# Patient Record
Sex: Female | Born: 1950 | Race: White | Hispanic: No | Marital: Married | Smoking: Former smoker
Health system: Southern US, Community
[De-identification: ages and names within clinical notes are randomized; demographics above are authoritative.]

## PROBLEM LIST (undated history)

## (undated) DIAGNOSIS — I341 Nonrheumatic mitral (valve) prolapse: Secondary | ICD-10-CM

## (undated) DIAGNOSIS — K219 Gastro-esophageal reflux disease without esophagitis: Secondary | ICD-10-CM

## (undated) DIAGNOSIS — I10 Essential (primary) hypertension: Secondary | ICD-10-CM

## (undated) DIAGNOSIS — M797 Fibromyalgia: Secondary | ICD-10-CM

## (undated) DIAGNOSIS — H269 Unspecified cataract: Secondary | ICD-10-CM

## (undated) DIAGNOSIS — G709 Myoneural disorder, unspecified: Secondary | ICD-10-CM

## (undated) DIAGNOSIS — M1990 Unspecified osteoarthritis, unspecified site: Secondary | ICD-10-CM

## (undated) DIAGNOSIS — J45909 Unspecified asthma, uncomplicated: Secondary | ICD-10-CM

## (undated) HISTORY — PX: AUGMENTATION MAMMAPLASTY: SUR837

## (undated) HISTORY — PX: TUBAL LIGATION: SHX77

## (undated) HISTORY — DX: Unspecified cataract: H26.9

## (undated) HISTORY — DX: Nonrheumatic mitral (valve) prolapse: I34.1

## (undated) HISTORY — PX: ABDOMINAL HYSTERECTOMY: SHX81

## (undated) HISTORY — PX: TONSILLECTOMY: SUR1361

## (undated) HISTORY — DX: Essential (primary) hypertension: I10

---

## 1998-09-09 ENCOUNTER — Encounter: Payer: Self-pay | Admitting: *Deleted

## 1998-09-14 ENCOUNTER — Inpatient Hospital Stay (HOSPITAL_COMMUNITY): Admission: RE | Admit: 1998-09-14 | Discharge: 1998-09-15 | Payer: Self-pay | Admitting: *Deleted

## 1999-03-02 ENCOUNTER — Encounter: Admission: RE | Admit: 1999-03-02 | Discharge: 1999-03-02 | Payer: Self-pay | Admitting: *Deleted

## 1999-03-02 ENCOUNTER — Encounter: Payer: Self-pay | Admitting: *Deleted

## 1999-04-12 ENCOUNTER — Other Ambulatory Visit: Admission: RE | Admit: 1999-04-12 | Discharge: 1999-04-12 | Payer: Self-pay | Admitting: *Deleted

## 1999-04-24 ENCOUNTER — Ambulatory Visit (HOSPITAL_COMMUNITY): Admission: RE | Admit: 1999-04-24 | Discharge: 1999-04-24 | Payer: Self-pay | Admitting: *Deleted

## 1999-04-24 ENCOUNTER — Encounter: Payer: Self-pay | Admitting: *Deleted

## 1999-05-18 ENCOUNTER — Encounter: Admission: RE | Admit: 1999-05-18 | Discharge: 1999-05-18 | Payer: Self-pay | Admitting: *Deleted

## 1999-05-18 ENCOUNTER — Encounter: Payer: Self-pay | Admitting: *Deleted

## 1999-06-12 ENCOUNTER — Encounter: Admission: RE | Admit: 1999-06-12 | Discharge: 1999-06-12 | Payer: Self-pay | Admitting: Neurology

## 1999-06-12 ENCOUNTER — Encounter: Payer: Self-pay | Admitting: Neurology

## 2000-03-06 ENCOUNTER — Encounter: Payer: Self-pay | Admitting: *Deleted

## 2000-03-06 ENCOUNTER — Encounter: Admission: RE | Admit: 2000-03-06 | Discharge: 2000-03-06 | Payer: Self-pay | Admitting: *Deleted

## 2000-05-06 ENCOUNTER — Other Ambulatory Visit: Admission: RE | Admit: 2000-05-06 | Discharge: 2000-05-06 | Payer: Self-pay | Admitting: *Deleted

## 2001-03-10 ENCOUNTER — Encounter: Admission: RE | Admit: 2001-03-10 | Discharge: 2001-03-10 | Payer: Self-pay | Admitting: *Deleted

## 2001-03-10 ENCOUNTER — Encounter: Payer: Self-pay | Admitting: *Deleted

## 2001-05-13 ENCOUNTER — Other Ambulatory Visit: Admission: RE | Admit: 2001-05-13 | Discharge: 2001-05-13 | Payer: Self-pay | Admitting: *Deleted

## 2002-06-08 ENCOUNTER — Other Ambulatory Visit: Admission: RE | Admit: 2002-06-08 | Discharge: 2002-06-08 | Payer: Self-pay | Admitting: *Deleted

## 2003-07-15 ENCOUNTER — Other Ambulatory Visit: Admission: RE | Admit: 2003-07-15 | Discharge: 2003-07-15 | Payer: Self-pay | Admitting: *Deleted

## 2004-10-10 ENCOUNTER — Other Ambulatory Visit: Admission: RE | Admit: 2004-10-10 | Discharge: 2004-10-10 | Payer: Self-pay | Admitting: *Deleted

## 2005-10-15 ENCOUNTER — Other Ambulatory Visit: Admission: RE | Admit: 2005-10-15 | Discharge: 2005-10-15 | Payer: Self-pay | Admitting: *Deleted

## 2006-02-14 ENCOUNTER — Emergency Department (HOSPITAL_COMMUNITY): Admission: EM | Admit: 2006-02-14 | Discharge: 2006-02-14 | Payer: Self-pay | Admitting: Emergency Medicine

## 2012-08-20 ENCOUNTER — Other Ambulatory Visit: Payer: Self-pay | Admitting: Internal Medicine

## 2012-08-20 DIAGNOSIS — Z1231 Encounter for screening mammogram for malignant neoplasm of breast: Secondary | ICD-10-CM

## 2012-08-27 ENCOUNTER — Ambulatory Visit
Admission: RE | Admit: 2012-08-27 | Discharge: 2012-08-27 | Disposition: A | Discharge status: Home / Self Care | Payer: 59 | Source: Ambulatory Visit | Attending: Internal Medicine | Admitting: Internal Medicine

## 2012-08-27 DIAGNOSIS — Z1231 Encounter for screening mammogram for malignant neoplasm of breast: Secondary | ICD-10-CM

## 2013-08-13 ENCOUNTER — Other Ambulatory Visit: Payer: Self-pay

## 2013-08-13 DIAGNOSIS — Z1231 Encounter for screening mammogram for malignant neoplasm of breast: Secondary | ICD-10-CM

## 2013-08-31 ENCOUNTER — Ambulatory Visit (INDEPENDENT_AMBULATORY_CARE_PROVIDER_SITE_OTHER): Payer: 59

## 2013-08-31 ENCOUNTER — Ambulatory Visit (INDEPENDENT_AMBULATORY_CARE_PROVIDER_SITE_OTHER): Payer: 59 | Admitting: Podiatry

## 2013-08-31 ENCOUNTER — Ambulatory Visit
Admission: RE | Admit: 2013-08-31 | Discharge: 2013-08-31 | Disposition: A | Discharge status: Home / Self Care | Payer: 59 | Source: Ambulatory Visit

## 2013-08-31 DIAGNOSIS — R52 Pain, unspecified: Secondary | ICD-10-CM

## 2013-08-31 DIAGNOSIS — M8448XA Pathological fracture, other site, initial encounter for fracture: Secondary | ICD-10-CM

## 2013-08-31 DIAGNOSIS — Z1231 Encounter for screening mammogram for malignant neoplasm of breast: Secondary | ICD-10-CM

## 2013-08-31 NOTE — Progress Notes (Signed)
   Subjective:    Patient ID: Jody Garrett, female    DOB: 30-May-1950, 63 y.o.   MRN: [id number]  HPI  Pt states that she was walking Friday afternoon, and felt pain in her left foot, then felt/heard a popping noise, and was unable to walk on left foot since. She is wearing a surgical shoe and has iced, elevated and used motrin to help with pain and inflammation. Pain is on the top and bottom of her foot at the 3rd met  Review of Systems  All other systems reviewed and are negative.      Objective:   Physical Exam        Assessment & Plan:

## 2013-08-31 NOTE — Progress Notes (Signed)
Subjective:     Patient ID: Jody Garrett, female   DOB: January 25, 1951, 63 y.o.   MRN: [id number]  Foot Pain   patient presents stating she's having a lot of pain in the forefoot left specifically around the third metatarsal and fourth metatarsal and feels like she felt a popping noise on Friday and she's been unable to bear weight since that time   Review of Systems  All other systems reviewed and are negative.      Objective:   Physical Exam  Nursing note and vitals reviewed. Constitutional: She is oriented to person, place, and time.  Cardiovascular: Intact distal pulses.   Musculoskeletal: Normal range of motion.  Neurological: She is oriented to person, place, and time.  Skin: Skin is warm.   neurovascular status intact with muscle strength adequate and range of motion subtalar midtarsal joint within normal limits. Patient's found to have digits that are well-perfused and arch height is mildly diminished and is noted to have edema in the forefoot left with pain mostly located around the fourth metatarsal distal shaft and also moderately in the third metatarsal distal shaft     Assessment:     Probable stress fracture of the left foot with possibility for other inflammatory condition    Plan:     X-ray reviewed and H&P discussed and today I applied Unna boot and Ace wrap to reduce swelling

## 2013-09-01 ENCOUNTER — Other Ambulatory Visit: Payer: Self-pay | Admitting: Internal Medicine

## 2013-09-01 DIAGNOSIS — R928 Other abnormal and inconclusive findings on diagnostic imaging of breast: Secondary | ICD-10-CM

## 2013-09-11 ENCOUNTER — Ambulatory Visit
Admission: RE | Admit: 2013-09-11 | Discharge: 2013-09-11 | Disposition: A | Discharge status: Home / Self Care | Payer: 59 | Source: Ambulatory Visit | Attending: Internal Medicine | Admitting: Internal Medicine

## 2013-09-11 DIAGNOSIS — R928 Other abnormal and inconclusive findings on diagnostic imaging of breast: Secondary | ICD-10-CM

## 2013-09-21 ENCOUNTER — Ambulatory Visit (INDEPENDENT_AMBULATORY_CARE_PROVIDER_SITE_OTHER): Payer: 59 | Admitting: Podiatry

## 2013-09-21 ENCOUNTER — Ambulatory Visit (INDEPENDENT_AMBULATORY_CARE_PROVIDER_SITE_OTHER): Payer: 59

## 2013-09-21 VITALS — BP 154/83 | HR 103 | Resp 16

## 2013-09-21 DIAGNOSIS — M8448XD Pathological fracture, other site, subsequent encounter for fracture with routine healing: Secondary | ICD-10-CM

## 2013-09-21 DIAGNOSIS — M8430XD Stress fracture, unspecified site, subsequent encounter for fracture with routine healing: Secondary | ICD-10-CM

## 2013-09-21 NOTE — Progress Notes (Signed)
Subjective:     Patient ID: Jody Garrett, female   DOB: 16-Aug-1950, 63 y.o.   MRN: [id number]  HPI patient states that her left foot is doing well but that there still is discomfort noted and she's not been able to go without her boot   Review of Systems     Objective:   Physical Exam Neurovascular status intact with discomfort in the fourth metatarsal neck left that is inflamed but not to the same degree his previous visit    Assessment:     Fracture fourth metatarsal left neck which is improving    Plan:     Rex-rayed and explained continued immobilization and dispensed a Darco shoe which she may begin gradually wearing over the next couple weeks

## 2013-09-21 NOTE — Progress Notes (Signed)
   Subjective:    Patient ID: Jody Garrett, female    DOB: March 19, 1950, 63 y.o.   MRN: [id number]  HPI Pt is here for f/u stress fracture left foot. States her foot feels much better but is still tender. Still wearing boot   Review of Systems     Objective:   Physical Exam        Assessment & Plan:

## 2013-10-19 ENCOUNTER — Encounter: Payer: Self-pay | Admitting: Podiatry

## 2013-10-19 ENCOUNTER — Ambulatory Visit (INDEPENDENT_AMBULATORY_CARE_PROVIDER_SITE_OTHER): Payer: 59 | Admitting: Podiatry

## 2013-10-19 ENCOUNTER — Ambulatory Visit (INDEPENDENT_AMBULATORY_CARE_PROVIDER_SITE_OTHER): Payer: 59

## 2013-10-19 VITALS — BP 124/60 | HR 60 | Resp 12

## 2013-10-19 DIAGNOSIS — S92309A Fracture of unspecified metatarsal bone(s), unspecified foot, initial encounter for closed fracture: Secondary | ICD-10-CM

## 2013-10-21 NOTE — Progress Notes (Signed)
Subjective:     Patient ID: Jody Garrett, female   DOB: 1951/02/10, 63 y.o.   MRN: [id number]  HPI patient states that left foot is improving but she continues to wear the boot which helps to take pressure off of it   Review of Systems     Objective:   Physical Exam Neurovascular status intact with muscle strength adequate in range of motion within normal limits with edema around the fourth metatarsal shaft left upon palpation    Assessment:     Stress fracture left that is improving but is still present    Plan:     X-rays reviewed and advised a gradual healing is occurring but she will need to continue with immobilization of the partial nature for the next several weeks. Reappoint for [place] to recheck again in 4 weeks

## 2014-08-26 ENCOUNTER — Other Ambulatory Visit: Payer: Self-pay

## 2014-08-26 DIAGNOSIS — Z1231 Encounter for screening mammogram for malignant neoplasm of breast: Secondary | ICD-10-CM

## 2014-09-03 ENCOUNTER — Ambulatory Visit
Admission: RE | Admit: 2014-09-03 | Discharge: 2014-09-03 | Disposition: A | Discharge status: Home / Self Care | Payer: Commercial Managed Care - HMO | Source: Ambulatory Visit

## 2014-09-03 DIAGNOSIS — Z1231 Encounter for screening mammogram for malignant neoplasm of breast: Secondary | ICD-10-CM

## 2015-06-08 ENCOUNTER — Other Ambulatory Visit: Payer: Self-pay | Admitting: Gastroenterology

## 2015-07-05 ENCOUNTER — Encounter (HOSPITAL_COMMUNITY): Payer: Self-pay | Admitting: *Deleted

## 2015-07-12 ENCOUNTER — Encounter (HOSPITAL_COMMUNITY): Payer: Self-pay

## 2015-07-12 ENCOUNTER — Ambulatory Visit (HOSPITAL_COMMUNITY)
Admission: RE | Admit: 2015-07-12 | Discharge: 2015-07-12 | Disposition: A | Discharge status: Home / Self Care | Payer: Commercial Managed Care - HMO | Source: Ambulatory Visit | Attending: Gastroenterology | Admitting: Gastroenterology

## 2015-07-12 ENCOUNTER — Encounter (HOSPITAL_COMMUNITY)
Admission: RE | Disposition: A | Discharge status: Home / Self Care | Payer: Self-pay | Source: Ambulatory Visit | Attending: Gastroenterology

## 2015-07-12 ENCOUNTER — Ambulatory Visit (HOSPITAL_COMMUNITY): Payer: Commercial Managed Care - HMO | Admitting: Certified Registered Nurse Anesthetist

## 2015-07-12 DIAGNOSIS — Z1211 Encounter for screening for malignant neoplasm of colon: Secondary | ICD-10-CM | POA: Diagnosis not present

## 2015-07-12 DIAGNOSIS — Z800 Family history of malignant neoplasm of digestive organs: Secondary | ICD-10-CM | POA: Diagnosis not present

## 2015-07-12 DIAGNOSIS — D124 Benign neoplasm of descending colon: Secondary | ICD-10-CM | POA: Diagnosis not present

## 2015-07-12 DIAGNOSIS — Z87891 Personal history of nicotine dependence: Secondary | ICD-10-CM | POA: Diagnosis not present

## 2015-07-12 DIAGNOSIS — Z86010 Personal history of colonic polyps: Secondary | ICD-10-CM | POA: Insufficient documentation

## 2015-07-12 HISTORY — PX: COLONOSCOPY WITH PROPOFOL: SHX5780

## 2015-07-12 HISTORY — DX: Myoneural disorder, unspecified: G70.9

## 2015-07-12 HISTORY — DX: Unspecified asthma, uncomplicated: J45.909

## 2015-07-12 HISTORY — DX: Unspecified osteoarthritis, unspecified site: M19.90

## 2015-07-12 HISTORY — DX: Fibromyalgia: M79.7

## 2015-07-12 HISTORY — DX: Gastro-esophageal reflux disease without esophagitis: K21.9

## 2015-07-12 SURGERY — COLONOSCOPY WITH PROPOFOL
Anesthesia: Monitor Anesthesia Care

## 2015-07-12 MED ORDER — PROPOFOL 10 MG/ML IV BOLUS
INTRAVENOUS | Status: AC
  Filled 2015-07-12: qty 40

## 2015-07-12 MED ORDER — LACTATED RINGERS IV SOLN
INTRAVENOUS | Status: DC
  Administered 2015-07-12: 1000 mL via INTRAVENOUS

## 2015-07-12 MED ORDER — SODIUM CHLORIDE 0.9 % IV SOLN: INTRAVENOUS | Status: DC

## 2015-07-12 MED ORDER — PROPOFOL 10 MG/ML IV BOLUS
INTRAVENOUS | Status: DC | PRN
  Administered 2015-07-12 (×3): 50 mg via INTRAVENOUS
  Administered 2015-07-12: 100 mg via INTRAVENOUS
  Administered 2015-07-12 (×3): 50 mg via INTRAVENOUS

## 2015-07-12 SURGICAL SUPPLY — 22 items

## 2015-07-12 NOTE — Op Note (Signed)
[facility] Patient Name: Jody Garrett Procedure Date: 07/12/2015 MRN: [id number] Attending MD: Garlan Fair , MD Date of Birth: May 27, 1950 CSN: [id number] Age: 65 Admit Type: Outpatient Procedure:                Colonoscopy Indications:              High risk colon cancer surveillance: colonoscopy                            was performed with removal of a tubulovillous                            adenomatous polyp in 2003. Father was diagnosed                            with colon cancer in his early 67s. Normal                            surveillance colonoscopy was performed on 08/15/2009 Providers:                Garlan Fair, MD, Elspeth Cho, Technician,                            Tory Emerald, RN Referring MD:              Medicines:                Propofol per Anesthesia Complications:            No immediate complications. Estimated Blood Loss:     Estimated blood loss: none. Procedure:                Pre-Anesthesia Assessment:                           - Prior to the procedure, a History and Physical                            was performed, and patient medications and                            allergies were reviewed. The patient's tolerance of                            previous anesthesia was also reviewed. The risks                            and benefits of the procedure and the sedation                            options and risks were discussed with the patient.                            All questions were answered, and informed consent  was obtained. Prior Anticoagulants: The patient has                            taken no previous anticoagulant or antiplatelet                            agents. ASA Grade Assessment: II - A patient with                            mild systemic disease. After reviewing the risks                            and benefits, the patient was deemed in   satisfactory condition to undergo the procedure.                           After obtaining informed consent, the colonoscope                            was passed under direct vision. Throughout the                            procedure, the patient's blood pressure, pulse, and                            oxygen saturations were monitored continuously. The                            Colonoscope was introduced through the anus and                            advanced to the the cecum, identified by                            appendiceal orifice and ileocecal valve. The                            colonoscopy was technically difficult and complex                            due to significant looping. The patient tolerated                            the procedure well. The quality of the bowel                            preparation was good. The terminal ileum, the                            ileocecal valve, the appendiceal orifice and the                            rectum were photographed. Scope In: 7:48:42 AM Scope Out: 8:22:49 AM Scope Withdrawal Time: 0 hours 12  minutes 48 seconds  Total Procedure Duration: 0 hours 34 minutes 7 seconds  Findings:      The perianal and digital rectal examinations were normal.      Two sessile polyps were found in the proximal descending colon. The       polyps were 5 mm in size. These polyps were removed with a cold snare.       Resection and retrieval were complete.      The exam was otherwise without abnormality. Impression:               - Two 5 mm polyps in the proximal descending colon,                            removed with a cold snare. Resected and retrieved.                           - The examination was otherwise normal. Moderate Sedation:      N/A- Per Anesthesia Care Recommendation:           - Patient has a contact number available for                            emergencies. The signs and symptoms of potential                             delayed complications were discussed with the                            patient. Return to normal activities tomorrow.                            Written discharge instructions were provided to the                            patient.                           - Repeat colonoscopy in 5 years for surveillance.                           - Resume previous diet.                           - Continue present medications. Procedure Code(s):        --- Professional ---                           [phone], Colonoscopy, flexible; with removal of                            tumor(s), polyp(s), or other lesion(s) by snare                            technique Diagnosis Code(s):        --- Professional ---  Z86.010, Personal history of colonic polyps                           D12.4, Benign neoplasm of descending colon CPT copyright 2016 [employer]. All rights reserved. The codes documented in this report are preliminary and upon coder review may  be revised to meet current compliance requirements. Earle Gell, MD Garlan Fair, MD 07/12/2015 8:31:16 AM This report has been signed electronically. Number of Addenda: 0

## 2015-07-12 NOTE — H&P (Signed)
  Procedure: Surveillance colonoscopy. Normal surveillance colonoscopy was performed on 08/15/2009. Colonoscopy was performed with removal of a tubulovillous adenomatous polyp in 2003. Father was diagnosed with colon cancer in his early 62s.  History: The patient is a 65 year old female born 1951-01-04. She is scheduled to undergo a repeat surveillance colonoscopy today.  Past medical history: Total abdominal hysterectomy. Bilateral salpingo-oophorectomy. Endometriosis. Bilateral tubal ligation. Ovarian cyst surgery. Tubulovillous adenomatous polyp removed colonoscopically in 2003. Gastroesophageal reflux. Fibromyalgia syndrome. Panic disorder. Hypercholesterolemia. Colonic diverticulosis.  Medication allergies: None  Exam: The patient is alert and lying comfortably on the endoscopy stretcher. Abdomen is soft and nontender to palpation. Lungs are clear to auscultation. Cardiac exam reveals a regular rhythm.  Plan: Proceed with surveillance colonoscopy

## 2015-07-12 NOTE — Discharge Instructions (Signed)

## 2015-07-12 NOTE — Transfer of Care (Signed)
Immediate Anesthesia Transfer of Care Note  Patient: Jody Garrett  Procedure(s) Performed: Procedure(s): COLONOSCOPY WITH PROPOFOL (N/A)  Patient Location: PACU  Anesthesia Type:MAC  Level of Consciousness: awake, alert  and oriented  Airway & Oxygen Therapy: Patient Spontanous Breathing and Patient connected to face mask oxygen  Post-op Assessment: Report given to RN and Post -op Vital signs reviewed and stable  Post vital signs: Reviewed and stable  Last Vitals:  Filed Vitals:   07/12/15 0725  BP: 147/62  Pulse: 86  Temp: 36.5 C  Resp: 15    Last Pain: There were no vitals filed for this visit.       Complications: No apparent anesthesia complications

## 2015-07-12 NOTE — Anesthesia Postprocedure Evaluation (Signed)
Anesthesia Post Note  Patient: Jody Garrett  Procedure(s) Performed: Procedure(s) (LRB): COLONOSCOPY WITH PROPOFOL (N/A)  Patient location during evaluation: PACU Anesthesia Type: MAC Level of consciousness: awake and alert Pain management: pain level controlled Vital Signs Assessment: post-procedure vital signs reviewed and stable Respiratory status: spontaneous breathing, nonlabored ventilation, respiratory function stable and patient connected to nasal cannula oxygen Cardiovascular status: stable and blood pressure returned to baseline Anesthetic complications: no    Last Vitals:  Filed Vitals:   07/12/15 0850 07/12/15 0855  BP: 145/77   Pulse: 42 73  Temp:    Resp: 22 18    Last Pain: There were no vitals filed for this visit.               [name] J

## 2015-07-12 NOTE — Anesthesia Preprocedure Evaluation (Addendum)
Anesthesia Evaluation  Patient identified by MRN, date of birth, ID band Patient awake    Reviewed: Allergy & Precautions, NPO status , Patient's Chart, lab work & pertinent test results  Airway Mallampati: II  TM Distance: >3 FB Neck ROM: Full    Dental no notable dental hx.    Pulmonary asthma , former smoker,    Pulmonary exam normal breath sounds clear to auscultation       Cardiovascular negative cardio ROS Normal cardiovascular exam Rhythm:Regular Rate:Normal     Neuro/Psych  Neuromuscular disease negative psych ROS   GI/Hepatic Neg liver ROS, GERD  ,  Endo/Other  negative endocrine ROS  Renal/GU negative Renal ROS  negative genitourinary   Musculoskeletal  (+) Arthritis , Fibromyalgia -  Abdominal   Peds negative pediatric ROS (+)  Hematology negative hematology ROS (+)   Anesthesia Other Findings   Reproductive/Obstetrics negative OB ROS                             Anesthesia Physical Anesthesia Plan  ASA: II  Anesthesia Plan: MAC   Post-op Pain Management:    Induction: Intravenous  Airway Management Planned: Natural Airway  Additional Equipment:   Intra-op Plan:   Post-operative Plan:   Informed Consent: I have reviewed the patients History and Physical, chart, labs and discussed the procedure including the risks, benefits and alternatives for the proposed anesthesia with the patient or authorized representative who has indicated his/her understanding and acceptance.   Dental advisory given  Plan Discussed with: CRNA  Anesthesia Plan Comments:         Anesthesia Quick Evaluation

## 2015-07-14 ENCOUNTER — Encounter (HOSPITAL_COMMUNITY): Payer: Self-pay | Admitting: Gastroenterology

## 2017-09-25 ENCOUNTER — Other Ambulatory Visit: Payer: Self-pay | Admitting: Internal Medicine

## 2017-09-25 DIAGNOSIS — Z1231 Encounter for screening mammogram for malignant neoplasm of breast: Secondary | ICD-10-CM

## 2017-09-25 DIAGNOSIS — Z23 Encounter for immunization: Secondary | ICD-10-CM | POA: Diagnosis not present

## 2017-09-25 DIAGNOSIS — E669 Obesity, unspecified: Secondary | ICD-10-CM | POA: Diagnosis not present

## 2017-09-25 DIAGNOSIS — Z1389 Encounter for screening for other disorder: Secondary | ICD-10-CM | POA: Diagnosis not present

## 2017-09-25 DIAGNOSIS — M797 Fibromyalgia: Secondary | ICD-10-CM | POA: Diagnosis not present

## 2017-09-25 DIAGNOSIS — Z6832 Body mass index (BMI) 32.0-32.9, adult: Secondary | ICD-10-CM | POA: Diagnosis not present

## 2017-09-25 DIAGNOSIS — R030 Elevated blood-pressure reading, without diagnosis of hypertension: Secondary | ICD-10-CM | POA: Diagnosis not present

## 2017-09-25 DIAGNOSIS — E7800 Pure hypercholesterolemia, unspecified: Secondary | ICD-10-CM | POA: Diagnosis not present

## 2017-09-25 DIAGNOSIS — F410 Panic disorder [episodic paroxysmal anxiety] without agoraphobia: Secondary | ICD-10-CM | POA: Diagnosis not present

## 2017-09-25 DIAGNOSIS — Z0000 Encounter for general adult medical examination without abnormal findings: Secondary | ICD-10-CM | POA: Diagnosis not present

## 2017-09-25 DIAGNOSIS — K219 Gastro-esophageal reflux disease without esophagitis: Secondary | ICD-10-CM | POA: Diagnosis not present

## 2017-09-25 DIAGNOSIS — M8580 Other specified disorders of bone density and structure, unspecified site: Secondary | ICD-10-CM | POA: Diagnosis not present

## 2017-10-15 ENCOUNTER — Ambulatory Visit
Admission: RE | Admit: 2017-10-15 | Discharge: 2017-10-15 | Disposition: A | Discharge status: Home / Self Care | Payer: Medicare Other | Source: Ambulatory Visit | Attending: Internal Medicine | Admitting: Internal Medicine

## 2017-10-15 DIAGNOSIS — Z1231 Encounter for screening mammogram for malignant neoplasm of breast: Secondary | ICD-10-CM

## 2017-10-28 DIAGNOSIS — M8588 Other specified disorders of bone density and structure, other site: Secondary | ICD-10-CM | POA: Diagnosis not present

## 2018-10-14 ENCOUNTER — Other Ambulatory Visit: Payer: Self-pay | Admitting: Internal Medicine

## 2018-10-14 DIAGNOSIS — Z1231 Encounter for screening mammogram for malignant neoplasm of breast: Secondary | ICD-10-CM

## 2018-10-22 DIAGNOSIS — E7800 Pure hypercholesterolemia, unspecified: Secondary | ICD-10-CM | POA: Diagnosis not present

## 2018-10-22 DIAGNOSIS — K219 Gastro-esophageal reflux disease without esophagitis: Secondary | ICD-10-CM | POA: Diagnosis not present

## 2018-10-22 DIAGNOSIS — Z1389 Encounter for screening for other disorder: Secondary | ICD-10-CM | POA: Diagnosis not present

## 2018-10-22 DIAGNOSIS — Z0000 Encounter for general adult medical examination without abnormal findings: Secondary | ICD-10-CM | POA: Diagnosis not present

## 2018-10-22 DIAGNOSIS — E669 Obesity, unspecified: Secondary | ICD-10-CM | POA: Diagnosis not present

## 2018-10-22 DIAGNOSIS — Z23 Encounter for immunization: Secondary | ICD-10-CM | POA: Diagnosis not present

## 2018-10-22 DIAGNOSIS — M797 Fibromyalgia: Secondary | ICD-10-CM | POA: Diagnosis not present

## 2018-10-22 DIAGNOSIS — R300 Dysuria: Secondary | ICD-10-CM | POA: Diagnosis not present

## 2018-11-26 ENCOUNTER — Other Ambulatory Visit: Payer: Self-pay

## 2018-11-26 ENCOUNTER — Ambulatory Visit
Admission: RE | Admit: 2018-11-26 | Discharge: 2018-11-26 | Disposition: A | Discharge status: Home / Self Care | Payer: Medicare Other | Source: Ambulatory Visit | Attending: Internal Medicine | Admitting: Internal Medicine

## 2018-11-26 DIAGNOSIS — Z1231 Encounter for screening mammogram for malignant neoplasm of breast: Secondary | ICD-10-CM | POA: Diagnosis not present

## 2018-11-26 LAB — HM MAMMOGRAPHY

## 2018-12-01 DIAGNOSIS — Z23 Encounter for immunization: Secondary | ICD-10-CM | POA: Diagnosis not present

## 2019-07-20 DIAGNOSIS — Z23 Encounter for immunization: Secondary | ICD-10-CM | POA: Diagnosis not present

## 2019-08-10 DIAGNOSIS — Z23 Encounter for immunization: Secondary | ICD-10-CM | POA: Diagnosis not present

## 2019-11-27 DIAGNOSIS — M797 Fibromyalgia: Secondary | ICD-10-CM | POA: Diagnosis not present

## 2019-11-27 DIAGNOSIS — E7800 Pure hypercholesterolemia, unspecified: Secondary | ICD-10-CM | POA: Diagnosis not present

## 2019-11-27 DIAGNOSIS — K219 Gastro-esophageal reflux disease without esophagitis: Secondary | ICD-10-CM | POA: Diagnosis not present

## 2019-11-27 DIAGNOSIS — N3281 Overactive bladder: Secondary | ICD-10-CM | POA: Diagnosis not present

## 2019-11-27 DIAGNOSIS — Z0000 Encounter for general adult medical examination without abnormal findings: Secondary | ICD-10-CM | POA: Diagnosis not present

## 2019-11-27 DIAGNOSIS — Z23 Encounter for immunization: Secondary | ICD-10-CM | POA: Diagnosis not present

## 2019-11-27 DIAGNOSIS — F410 Panic disorder [episodic paroxysmal anxiety] without agoraphobia: Secondary | ICD-10-CM | POA: Diagnosis not present

## 2019-11-27 DIAGNOSIS — Z1389 Encounter for screening for other disorder: Secondary | ICD-10-CM | POA: Diagnosis not present

## 2020-01-19 ENCOUNTER — Other Ambulatory Visit: Payer: Self-pay | Admitting: Internal Medicine

## 2020-01-19 DIAGNOSIS — Z0000 Encounter for general adult medical examination without abnormal findings: Secondary | ICD-10-CM

## 2020-03-01 ENCOUNTER — Ambulatory Visit
Admission: RE | Admit: 2020-03-01 | Discharge: 2020-03-01 | Disposition: A | Discharge status: Home / Self Care | Payer: Medicare Other | Source: Ambulatory Visit | Attending: Internal Medicine | Admitting: Internal Medicine

## 2020-03-01 ENCOUNTER — Other Ambulatory Visit: Payer: Self-pay

## 2020-03-01 DIAGNOSIS — Z1231 Encounter for screening mammogram for malignant neoplasm of breast: Secondary | ICD-10-CM | POA: Diagnosis not present

## 2020-03-01 DIAGNOSIS — Z0000 Encounter for general adult medical examination without abnormal findings: Secondary | ICD-10-CM

## 2020-03-01 LAB — HM MAMMOGRAPHY

## 2020-03-30 DIAGNOSIS — H5213 Myopia, bilateral: Secondary | ICD-10-CM | POA: Diagnosis not present

## 2020-03-30 DIAGNOSIS — H2513 Age-related nuclear cataract, bilateral: Secondary | ICD-10-CM | POA: Diagnosis not present

## 2020-03-30 DIAGNOSIS — H3500 Unspecified background retinopathy: Secondary | ICD-10-CM | POA: Diagnosis not present

## 2020-04-19 DIAGNOSIS — R030 Elevated blood-pressure reading, without diagnosis of hypertension: Secondary | ICD-10-CM | POA: Diagnosis not present

## 2020-04-19 DIAGNOSIS — R000 Tachycardia, unspecified: Secondary | ICD-10-CM | POA: Diagnosis not present

## 2020-04-22 ENCOUNTER — Telehealth: Payer: Self-pay | Admitting: *Deleted

## 2020-04-22 NOTE — Telephone Encounter (Signed)
NOTES ON FILE FROM [facility] [place]. [phone]. REFERRAL SENT TO SCHEDULING.

## 2020-04-29 ENCOUNTER — Other Ambulatory Visit (HOSPITAL_COMMUNITY): Payer: Self-pay | Admitting: Internal Medicine

## 2020-04-29 DIAGNOSIS — I493 Ventricular premature depolarization: Secondary | ICD-10-CM

## 2020-04-29 DIAGNOSIS — R0602 Shortness of breath: Secondary | ICD-10-CM

## 2020-04-29 DIAGNOSIS — R42 Dizziness and giddiness: Secondary | ICD-10-CM

## 2020-04-29 DIAGNOSIS — I10 Essential (primary) hypertension: Secondary | ICD-10-CM

## 2020-04-29 DIAGNOSIS — R000 Tachycardia, unspecified: Secondary | ICD-10-CM

## 2020-05-27 ENCOUNTER — Encounter: Payer: Self-pay | Admitting: Radiology

## 2020-05-27 ENCOUNTER — Ambulatory Visit (INDEPENDENT_AMBULATORY_CARE_PROVIDER_SITE_OTHER): Payer: Medicare Other | Admitting: Internal Medicine

## 2020-05-27 ENCOUNTER — Ambulatory Visit (INDEPENDENT_AMBULATORY_CARE_PROVIDER_SITE_OTHER): Payer: Medicare Other

## 2020-05-27 ENCOUNTER — Other Ambulatory Visit: Payer: Self-pay

## 2020-05-27 ENCOUNTER — Encounter: Payer: Self-pay | Admitting: Internal Medicine

## 2020-05-27 ENCOUNTER — Ambulatory Visit (HOSPITAL_COMMUNITY): Payer: Medicare Other | Attending: Cardiology

## 2020-05-27 VITALS — BP 122/68 | HR 94 | Ht 65.0 in | Wt 189.0 lb

## 2020-05-27 DIAGNOSIS — I493 Ventricular premature depolarization: Secondary | ICD-10-CM | POA: Diagnosis not present

## 2020-05-27 DIAGNOSIS — R030 Elevated blood-pressure reading, without diagnosis of hypertension: Secondary | ICD-10-CM | POA: Diagnosis not present

## 2020-05-27 DIAGNOSIS — I10 Essential (primary) hypertension: Secondary | ICD-10-CM | POA: Insufficient documentation

## 2020-05-27 DIAGNOSIS — E785 Hyperlipidemia, unspecified: Secondary | ICD-10-CM

## 2020-05-27 DIAGNOSIS — I340 Nonrheumatic mitral (valve) insufficiency: Secondary | ICD-10-CM | POA: Diagnosis not present

## 2020-05-27 LAB — ECHOCARDIOGRAM COMPLETE
Area-P 1/2: 4.83 cm2
S' Lateral: 2.85 cm

## 2020-05-27 NOTE — Progress Notes (Signed)
Cardiology Office Note:    Date:  05/27/2020   ID:  Moss Mc, DOB 1950-09-14, MRN [id number]  PCP:  Lavone Orn, [place]  Cardiologist:  Werner Lean, MD  Advanced Practice Provider:  No care team member to display Electrophysiologist:  None       CC: Heart Palpitations Consulted for the evaluation of chest pain at the behest of Lavone Orn, MD  History of Present Illness:    Jody Garrett is a 70 y.o. female with a hx of asthma, Elevated BP, HLD, MVP who presents for evaluation 05/27/20.  Patient notes that she is feeling chest pressure with elevated BP . Patient was feeling funny heart beats not every day but felt the specifically when she was anxious.  Has had spells of funny heart beats but hasn't had that in a month.  Has not had these since starting metoprolol.  Drinks only decaf.  Has had no chest pain, chest pressure, chest tightness, chest stinging .  Discomfort occurs with the funny heart beats only.  Patient exertion notable for riding a recumbent bike  with  and feels even better.  No shortness of breath with exertion (riding on her back).  No PND or orthopnea.  No syncope or near syncope .  Patient reports prior cardiac testing including distant echo  No history of pre-eclampsia, early menarche, or prematurity.  Ambulatory BP went from 160 to 170 off metoprolol; are 120-130s now.   Past Medical History:  Diagnosis Date  . Arthritis   . Asthma    as child  . Fibromyalgia   . GERD (gastroesophageal reflux disease)   . Neuromuscular disorder ([place])    headaches    Past Surgical History:  Procedure Laterality Date  . ABDOMINAL HYSTERECTOMY    . AUGMENTATION MAMMAPLASTY Bilateral    has had implants removed  . COLONOSCOPY WITH PROPOFOL N/A 07/12/2015   Procedure: COLONOSCOPY WITH PROPOFOL;  Surgeon: Garlan Fair, MD;  Location: [facility];  Service: Endoscopy;  Laterality: N/A;  .  TONSILLECTOMY    . TUBAL LIGATION      Current Medications: Current Meds  Medication Sig  . ALPRAZolam (XANAX) 0.25 MG tablet Take 0.25 mg by mouth daily.  Marland Kitchen atorvastatin (LIPITOR) 10 MG tablet Take 10 mg by mouth daily.  . Calcium Citrate-Vitamin D (CALCIUM CITRATE+D3 PO) Take 1 tablet by mouth 2 (two) times daily.  . Cholecalciferol (VITAMIN D3) 10000 units TABS Take 1,000 Units by mouth daily.  . cyclobenzaprine (FLEXERIL) 10 MG tablet Take 10 mg by mouth 3 (three) times daily as needed for muscle spasms.  . diphenhydrAMINE-APAP, sleep, (TYLENOL PM EXTRA STRENGTH PO) Take by mouth as needed.  Marland Kitchen esomeprazole (NEXIUM) 20 MG capsule Take 20 mg by mouth daily.   Marland Kitchen ibuprofen (ADVIL,MOTRIN) 200 MG tablet Take 800 mg by mouth every 6 (six) hours as needed (For pain.).  Marland Kitchen metoprolol succinate (TOPROL-XL) 25 MG 24 hr tablet Take 25 mg by mouth daily.  Marland Kitchen neomycin-bacitracin-polymyxin (NEOSPORIN) ointment Apply 1 application topically as needed for wound care. apply to eye  . OVER THE COUNTER MEDICATION Place 2 drops into both eyes 4 (four) times daily as needed (For eye redness or irritation.). [employer] Advanced Eye Relief Maximum Redness Eye Drops  . venlafaxine XR (EFFEXOR-XR) 75 MG 24 hr capsule Take 150 mg by mouth at bedtime.  Marland Kitchen VIVELLE-DOT 0.1 MG/24HR patch Place 1 patch onto the skin every Wednesday and Saturday.  Allergies:   Patient has no allergy information on record.   Social History   Socioeconomic History  . Marital status: Married    Spouse name: Not on file  . Number of children: Not on file  . Years of education: Not on file  . Highest education level: Not on file  Occupational History  . Not on file  Tobacco Use  . Smoking status: Former Smoker    Quit date: 07/04/2000    Years since quitting: 19.9  . Smokeless tobacco: Never Used  Vaping Use  . Vaping Use: Never used  Substance and Sexual Activity  . Alcohol use: No  . Drug use: No  . Sexual activity:  Not on file  Other Topics Concern  . Not on file  Social History Narrative  . Not on file   Social Determinants of Health   Financial Resource Strain: Not on file  Food Insecurity: Not on file  Transportation Needs: Not on file  Physical Activity: Not on file  Stress: Not on file  Social Connections: Not on file     Family History: History of coronary artery disease notable for grandparents. History of heart failure notable for grandmother. No heart valve problems History of arrhythmia notable for no members.  ROS:   Please see the history of present illness.     All other systems reviewed and are negative.  EKGs/Labs/Other Studies Reviewed:    following studies were reviewed today:  EKG:   05/27/20: Frequent PVCs  Echo:  Normal LVEF; Mild MR with posterior restriction 1. Left ventricular ejection fraction, by estimation, is 55 to 60%. The  left ventricle has normal function. The left ventricle has no regional  wall motion abnormalities. There is mild left ventricular hypertrophy.  Left ventricular diastolic parameters  are consistent with Grade I diastolic dysfunction (impaired relaxation).  2. Right ventricular systolic function is normal. The right ventricular  size is normal.  3. The mitral valve is normal in structure. Mild mitral valve  regurgitation. No evidence of mitral stenosis.  4. The aortic valve is tricuspid. Aortic valve regurgitation is not  visualized. Mild aortic valve sclerosis is present, with no evidence of  aortic valve stenosis.   Recent Labs: No results found for requested labs within last 8760 hours.  Recent Lipid Panel No results found for: CHOL, TRIG, HDL, CHOLHDL, VLDL, LDLCALC, LDLDIRECT   Outside Hospital  or Outside Clinic Studies (OSH):  Date: 11/27/19 Cholesterol 140 HDL 50 LDL 64 Tgs 151 04/19/20 Creatinine 0.81 TSH 3.44 BNP NA   Risk Assessment/Calculations:     N/A  Physical Exam:    VS:  BP 122/68   Pulse 94    Ht 5\' 5"  (1.651 m)   Wt 189 lb (85.7 kg)   SpO2 97%   BMI 31.45 kg/m     Wt Readings from Last 3 Encounters:  05/27/20 189 lb (85.7 kg)  07/12/15 160 lb (72.6 kg)    GEN:  Well nourished, well developed in no acute distress HEENT: Normal NECK: No JVD; No carotid bruits LYMPHATICS: No lymphadenopathy CARDIAC: irregularly irregular hear beats, no murmurs, rubs, gallops RESPIRATORY:  Clear to auscultation without rales, wheezing or rhonchi  ABDOMEN: Soft, non-tender, non-distended MUSCULOSKELETAL:  No edema; No deformity  SKIN: Warm and dry NEUROLOGIC:  Alert and oriented x 3 PSYCHIATRIC:  Normal affect   ASSESSMENT:    1. PVC (premature ventricular contraction)   2. Hyperlipidemia, unspecified hyperlipidemia type   3. Nonrheumatic mitral valve regurgitation  4. Elevated blood pressure reading    PLAN:    In order of problems listed above:  PVC Elevated BP readings - will obtain 14-day non live heart monitor (ZioPatch) - AV Nodal Therapy: metoprolol succinate 25 mg Po Daily - continue BB  MVP and Mild MR - Echo in 3 years unless new findings  HLD- controlled on statin   6 months follow up unless new symptoms or abnormal test results warranting change in plan  Would be reasonable for  Video Visit Follow up Would be reasonable for  APP Follow up    Medication Adjustments/Labs and Tests Ordered: Current medicines are reviewed at length with the patient today.  Concerns regarding medicines are outlined above.  Orders Placed This Encounter  Procedures  . LONG TERM MONITOR (3-14 DAYS)  . EKG 12-Lead   No orders of the defined types were placed in this encounter.   Patient Instructions  Medication Instructions:  Your physician recommends that you continue on your current medications as directed. Please refer to the Current Medication list given to you today.  *If you need a refill on your cardiac medications before your next appointment, please call your  pharmacy*   Lab Work: NONE If you have labs (blood work) drawn today and your tests are completely normal, you will receive your results only by: Marland Kitchen MyChart Message (if you have MyChart) OR . A paper copy in the mail If you have any lab test that is abnormal or we need to change your treatment, we will call you to review the results.   Testing/Procedures: Your physician has requested that you wear a 14 day heart monitor.    Follow-Up: At [facility], you and your health needs are our priority.  As part of our continuing mission to provide you with exceptional heart care, we have created designated Provider Care Teams.  These Care Teams include your primary Cardiologist (physician) and Advanced Practice Providers (APPs -  Physician Assistants and Nurse Practitioners) who all work together to provide you with the care you need, when you need it.  We recommend signing up for the patient portal called "MyChart".  Sign up information is provided on this After Visit Summary.  MyChart is used to connect with patients for Virtual Visits (Telemedicine).  Patients are able to view lab/test results, encounter notes, upcoming appointments, etc.  Non-urgent messages can be sent to your provider as well.   To learn more about what you can do with MyChart, go to [email or web address].    Your next appointment:   6 month(s)  The format for your next appointment:   In Person  Provider:   You may see Werner Lean, MD or one of the following Advanced Practice Providers on your designated Care Team:    Melina Copa, PA-C  Ermalinda Barrios, PA-C  Other Instructions Bryn Gulling- Long Term Monitor Instructions   Your physician has requested you wear your ZIO patch monitor___14____days.   This is a single patch monitor.  [employer] supplies one patch monitor per enrollment.  Additional stickers are not available.   Please do not apply patch if you will be having a Nuclear Stress Test,  Echocardiogram, Cardiac CT, MRI, or Chest Xray during the time frame you would be wearing the monitor. The patch cannot be worn during these tests.  You cannot remove and re-apply the ZIO XT patch monitor.   Your ZIO patch monitor will be sent USPS Priority mail from [facility] directly to your home  address. The monitor may also be mailed to a PO BOX if home delivery is not available.   It may take 3-5 days to receive your monitor after you have been enrolled.   Once you have received you monitor, please review enclosed instructions.  Your monitor has already been registered assigning a specific monitor serial # to you.   Applying the monitor   Shave hair from upper left chest.   Hold abrader disc by orange tab.  Rub abrader in 40 strokes over left upper chest as indicated in your monitor instructions.   Clean area with 4 enclosed alcohol pads .  Use all pads to assure are is cleaned thoroughly.  Let dry.   Apply patch as indicated in monitor instructions.  Patch will be place under collarbone on left side of chest with arrow pointing upward.   Rub patch adhesive wings for 2 minutes.Remove white label marked "1".  Remove white label marked "2".  Rub patch adhesive wings for 2 additional minutes.   While looking in a mirror, press and release button in center of patch.  A small green light will flash 3-4 times .  This will be your only indicator the monitor has been turned on.     Do not shower for the first 24 hours.  You may shower after the first 24 hours.   Press button if you feel a symptom. You will hear a small click.  Record Date, Time and Symptom in the Patient Log Book.   When you are ready to remove patch, follow instructions on last 2 pages of Patient Log Book.  Stick patch monitor onto last page of Patient Log Book.   Place Patient Log Book in [place] box.  Use locking tab on box and tape box closed securely.  The [employer] has [employer] on it.  Please  place in mailbox as soon as possible.  Your physician should have your test results approximately 7 days after the monitor has been mailed back to [facility].   Call [place] at [phone] if you have questions regarding your ZIO XT patch monitor.  Call them immediately if you see an orange light blinking on your monitor.   If your monitor falls off in less than 4 days contact our Monitor department at [phone].  If your monitor becomes loose or falls off after 4 days call [employer] at [phone] for suggestions on securing your monitor.            Signed, Werner Lean, MD  05/27/2020 4:48 PM    [place]

## 2020-05-27 NOTE — Patient Instructions (Addendum)
Medication Instructions:  Your physician recommends that you continue on your current medications as directed. Please refer to the Current Medication list given to you today.  *If you need a refill on your cardiac medications before your next appointment, please call your pharmacy*   Lab Work: NONE If you have labs (blood work) drawn today and your tests are completely normal, you will receive your results only by: Marland Kitchen MyChart Message (if you have MyChart) OR . A paper copy in the mail If you have any lab test that is abnormal or we need to change your treatment, we will call you to review the results.   Testing/Procedures: Your physician has requested that you wear a 14 day heart monitor.    Follow-Up: At [facility], you and your health needs are our priority.  As part of our continuing mission to provide you with exceptional heart care, we have created designated Provider Care Teams.  These Care Teams include your primary Cardiologist (physician) and Advanced Practice Providers (APPs -  Physician Assistants and Nurse Practitioners) who all work together to provide you with the care you need, when you need it.  We recommend signing up for the patient portal called "MyChart".  Sign up information is provided on this After Visit Summary.  MyChart is used to connect with patients for Virtual Visits (Telemedicine).  Patients are able to view lab/test results, encounter notes, upcoming appointments, etc.  Non-urgent messages can be sent to your provider as well.   To learn more about what you can do with MyChart, go to [email or web address].    Your next appointment:   6 month(s)  The format for your next appointment:   In Person  Provider:   You may see Werner Lean, MD or one of the following Advanced Practice Providers on your designated Care Team:    Melina Copa, PA-C  Ermalinda Barrios, PA-C  Other Instructions Bryn Gulling- Long Term Monitor Instructions   Your physician  has requested you wear your ZIO patch monitor___14____days.   This is a single patch monitor.  [employer] supplies one patch monitor per enrollment.  Additional stickers are not available.   Please do not apply patch if you will be having a Nuclear Stress Test, Echocardiogram, Cardiac CT, MRI, or Chest Xray during the time frame you would be wearing the monitor. The patch cannot be worn during these tests.  You cannot remove and re-apply the ZIO XT patch monitor.   Your ZIO patch monitor will be sent USPS Priority mail from [facility] directly to your home address. The monitor may also be mailed to a PO BOX if home delivery is not available.   It may take 3-5 days to receive your monitor after you have been enrolled.   Once you have received you monitor, please review enclosed instructions.  Your monitor has already been registered assigning a specific monitor serial # to you.   Applying the monitor   Shave hair from upper left chest.   Hold abrader disc by orange tab.  Rub abrader in 40 strokes over left upper chest as indicated in your monitor instructions.   Clean area with 4 enclosed alcohol pads .  Use all pads to assure are is cleaned thoroughly.  Let dry.   Apply patch as indicated in monitor instructions.  Patch will be place under collarbone on left side of chest with arrow pointing upward.   Rub patch adhesive wings for 2 minutes.Remove white label marked "1".  Remove  white label marked "2".  Rub patch adhesive wings for 2 additional minutes.   While looking in a mirror, press and release button in center of patch.  A small green light will flash 3-4 times .  This will be your only indicator the monitor has been turned on.     Do not shower for the first 24 hours.  You may shower after the first 24 hours.   Press button if you feel a symptom. You will hear a small click.  Record Date, Time and Symptom in the Patient Log Book.   When you are ready to remove patch, follow  instructions on last 2 pages of Patient Log Book.  Stick patch monitor onto last page of Patient Log Book.   Place Patient Log Book in [place] box.  Use locking tab on box and tape box closed securely.  The [employer] has [employer] on it.  Please place in mailbox as soon as possible.  Your physician should have your test results approximately 7 days after the monitor has been mailed back to [facility].   Call [place] at [phone] if you have questions regarding your ZIO XT patch monitor.  Call them immediately if you see an orange light blinking on your monitor.   If your monitor falls off in less than 4 days contact our Monitor department at [phone].  If your monitor becomes loose or falls off after 4 days call [employer] at [phone] for suggestions on securing your monitor.

## 2020-05-27 NOTE — Progress Notes (Signed)
Enrolled patient for a 14 day Zio XT  monitor to be mailed to patients home  °

## 2020-06-01 DIAGNOSIS — I493 Ventricular premature depolarization: Secondary | ICD-10-CM | POA: Diagnosis not present

## 2020-06-21 DIAGNOSIS — I493 Ventricular premature depolarization: Secondary | ICD-10-CM | POA: Diagnosis not present

## 2020-06-22 ENCOUNTER — Telehealth: Payer: Self-pay

## 2020-06-22 DIAGNOSIS — I493 Ventricular premature depolarization: Secondary | ICD-10-CM

## 2020-06-22 MED ORDER — METOPROLOL SUCCINATE ER 50 MG PO TB24: 50.0000 mg | ORAL_TABLET | Freq: Every day | ORAL | 3 refills | Status: DC

## 2020-06-22 NOTE — Telephone Encounter (Signed)
-----   Message from Werner Lean, MD sent at 06/22/2020  8:04 AM EDT ----- Results: Frequent PVCs on metoprolol Plan: Increase metoprolol succinate to 50 mg PO Daily Will get Stress CMR Decrease caffeine intake  Werner Lean, MD

## 2020-06-22 NOTE — Telephone Encounter (Signed)
Called patient and reviewed results and MD recommendations.  She reports that she has not had caffeine in years.  She is agreeable to metoprolol succinate increase to 50 mg PO QD and stress cardiac mri.  I explained to her what stress cardiac is used for and that it will be performed at [facility].

## 2020-06-28 ENCOUNTER — Telehealth: Payer: Self-pay | Admitting: Internal Medicine

## 2020-06-28 NOTE — Telephone Encounter (Signed)
Spoke with patient regarding preferred scheduling weekdays and times for the Cardiac Stress MRI ordered by Dr. Montey Hora patient as soon as we hear from the insurance company regarding the prior authorization, someone will be in touch with her appointment---she voiced her understanding.

## 2020-07-15 ENCOUNTER — Encounter: Payer: Self-pay | Admitting: Internal Medicine

## 2020-07-15 NOTE — Telephone Encounter (Signed)
Spoke with patient regarding the Friday 08/26/20 12:00 pm Cardiac Stress MRI at [facility]---arrival time is 11:30 am--1st floor admissions office for check in--will mail information to patient and it is available in My Chart.  Patient voiced her understanding.

## 2020-07-18 ENCOUNTER — Other Ambulatory Visit: Payer: Self-pay

## 2020-07-18 DIAGNOSIS — I340 Nonrheumatic mitral (valve) insufficiency: Secondary | ICD-10-CM

## 2020-07-18 DIAGNOSIS — R030 Elevated blood-pressure reading, without diagnosis of hypertension: Secondary | ICD-10-CM

## 2020-07-18 DIAGNOSIS — I493 Ventricular premature depolarization: Secondary | ICD-10-CM

## 2020-07-18 NOTE — Progress Notes (Signed)
Placed orders for labs for upcoming stress cardiac mri.

## 2020-08-22 ENCOUNTER — Other Ambulatory Visit: Payer: Medicare Other

## 2020-08-22 DIAGNOSIS — R030 Elevated blood-pressure reading, without diagnosis of hypertension: Secondary | ICD-10-CM | POA: Diagnosis not present

## 2020-08-22 DIAGNOSIS — I493 Ventricular premature depolarization: Secondary | ICD-10-CM | POA: Diagnosis not present

## 2020-08-22 DIAGNOSIS — I340 Nonrheumatic mitral (valve) insufficiency: Secondary | ICD-10-CM | POA: Diagnosis not present

## 2020-08-23 LAB — BASIC METABOLIC PANEL
BUN/Creatinine Ratio: 10 — ABNORMAL LOW (ref 12–28)
BUN: 8 mg/dL (ref 8–27)
CO2: 24 mmol/L (ref 20–29)
Calcium: 9 mg/dL (ref 8.7–10.3)
Chloride: 103 mmol/L (ref 96–106)
Creatinine, Ser: 0.8 mg/dL (ref 0.57–1.00)
Glucose: 86 mg/dL (ref 65–99)
Potassium: 4.7 mmol/L (ref 3.5–5.2)
Sodium: 138 mmol/L (ref 134–144)
eGFR: 80 mL/min/{1.73_m2} (ref >59)

## 2020-08-23 LAB — CBC
Hematocrit: 37.1 % (ref 34.0–46.6)
Hemoglobin: 12.8 g/dL (ref 11.1–15.9)
MCH: 30.1 pg (ref 26.6–33.0)
MCHC: 34.5 g/dL (ref 31.5–35.7)
MCV: 87 fL (ref 79–97)
Platelets: 203 10*3/uL (ref 150–450)
RBC: 4.25 x10E6/uL (ref 3.77–5.28)
RDW: 12.4 % (ref 11.7–15.4)
WBC: 5.3 10*3/uL (ref 3.4–10.8)

## 2020-08-24 ENCOUNTER — Telehealth (HOSPITAL_COMMUNITY): Payer: Self-pay | Admitting: Emergency Medicine

## 2020-08-24 ENCOUNTER — Other Ambulatory Visit (HOSPITAL_COMMUNITY): Payer: Self-pay | Admitting: Emergency Medicine

## 2020-08-24 DIAGNOSIS — I493 Ventricular premature depolarization: Secondary | ICD-10-CM

## 2020-08-24 NOTE — Telephone Encounter (Signed)
Reaching out to patient to offer assistance regarding upcoming cardiac imaging study; pt verbalizes understanding of appt date/time, parking situation and where to check in, pre-test NPO status and medications ordered, and verified current allergies; name and call back number provided for further questions should they arise Jody Bond RN Navigator Cardiac Imaging [facility] [phone] office [phone] cell  Informed of EKG at 11:15am, pt denies implants other than dental partial. Reports some claustro (has home rx for 0.25mg  xanax) instructed her not to take until I clarify with Jody Garrett regarding whether he advises she can take or not Jody Garrett

## 2020-08-26 ENCOUNTER — Encounter: Payer: Self-pay | Admitting: Cardiology

## 2020-08-26 ENCOUNTER — Ambulatory Visit (HOSPITAL_COMMUNITY)
Admission: RE | Admit: 2020-08-26 | Discharge: 2020-08-26 | Disposition: A | Discharge status: Home / Self Care | Payer: Medicare Other | Source: Ambulatory Visit | Attending: Internal Medicine | Admitting: Internal Medicine

## 2020-08-26 ENCOUNTER — Other Ambulatory Visit: Payer: Self-pay

## 2020-08-26 ENCOUNTER — Ambulatory Visit (HOSPITAL_COMMUNITY)
Admission: RE | Admit: 2020-08-26 | Discharge: 2020-08-26 | Disposition: A | Discharge status: Home / Self Care | Payer: Medicare Other | Source: Ambulatory Visit | Attending: Cardiology | Admitting: Cardiology

## 2020-08-26 DIAGNOSIS — I493 Ventricular premature depolarization: Secondary | ICD-10-CM | POA: Insufficient documentation

## 2020-08-26 MED ORDER — GADOBUTROL 1 MMOL/ML IV SOLN
10.0000 mL | Freq: Once | INTRAVENOUS | Status: AC | PRN
  Administered 2020-08-26: 10 mL via INTRAVENOUS

## 2020-08-26 MED ORDER — REGADENOSON 0.4 MG/5ML IV SOLN
INTRAVENOUS | Status: AC
  Filled 2020-08-26: qty 5

## 2020-08-26 MED ORDER — ALBUTEROL SULFATE HFA 108 (90 BASE) MCG/ACT IN AERS
INHALATION_SPRAY | RESPIRATORY_TRACT | Status: AC
  Filled 2020-08-26: qty 6.7

## 2020-08-26 MED ORDER — AMINOPHYLLINE 25 MG/ML IV SOLN
INTRAVENOUS | Status: AC
  Filled 2020-08-26: qty 10

## 2020-08-26 MED ORDER — NITROGLYCERIN 0.4 MG SL SUBL
SUBLINGUAL_TABLET | SUBLINGUAL | Status: AC
  Filled 2020-08-26: qty 1

## 2020-08-26 NOTE — Progress Notes (Signed)
Jody Garrett presents for stress MRI today.  BP 155/54, P 82.  EKG shows sinus rhythm with frequent PVCs.  Lungs CTAB.  Shared Decision Making/Informed Consent The risks [chest pain, shortness of breath, cardiac arrhythmias, dizziness, blood pressure fluctuations, myocardial infarction, stroke/transient ischemic attack, nausea, vomiting, allergic reaction, and life-threatening complications (estimated to be 1 in 10,000)], benefits (risk stratification, diagnosing coronary artery disease, treatment guidance) and alternatives of a MRI stress test were discussed in detail with Jody. [place] and she agrees to proceed.

## 2020-08-26 NOTE — Progress Notes (Signed)
Post Lexi-scan administration VS. Dr. Gardiner Rhyme at [place].  Pt denies chest pain or SOB.

## 2020-08-29 ENCOUNTER — Telehealth: Payer: Self-pay

## 2020-08-29 ENCOUNTER — Ambulatory Visit (INDEPENDENT_AMBULATORY_CARE_PROVIDER_SITE_OTHER): Payer: Medicare Other

## 2020-08-29 DIAGNOSIS — I493 Ventricular premature depolarization: Secondary | ICD-10-CM

## 2020-08-29 NOTE — Telephone Encounter (Signed)
-----   Message from Werner Lean, MD sent at 08/28/2020  5:32 PM EDT ----- Results: No evidence of blockage or LGE Plan: ZioPatch for PVC follow up  Werner Lean, MD

## 2020-08-29 NOTE — Progress Notes (Unsigned)
Patient enrolled for [employer] to mail a 14 day ZIO XT monitor to address on file. 

## 2020-08-29 NOTE — Telephone Encounter (Signed)
Reviewed results and MD recommendations with patient.  She is agreeable to plan and verbalizes understanding.  Zio patch instructions placed on My Chart for pt to review.   She expresses that she recently wore a heart monitor and remembers instructions.

## 2020-10-03 DIAGNOSIS — I493 Ventricular premature depolarization: Secondary | ICD-10-CM | POA: Diagnosis not present

## 2020-10-25 ENCOUNTER — Telehealth: Payer: Self-pay | Admitting: Internal Medicine

## 2020-10-25 NOTE — Telephone Encounter (Signed)
Patient is calling in to speak with the nurse in regards to her heart monitor. Please advise

## 2020-10-25 NOTE — Telephone Encounter (Signed)
Spoke with pt and monitor was ordered and pt delayed on wearing for quite sometime due to stressful period Per pt did try and put monitor and will not stick Instructed pt to contact monitor company to have them resend a monitor Pt verbalized understanding and if has issues with request will call back ./cy

## 2020-10-26 NOTE — Telephone Encounter (Signed)
Contacted [employer] representative.  It will not be necessary to submit a new order, a replacement can be sent with the existing order. Contacted patient to let her know a replacement monitor is being mailed to her home.  She can return the original monitor for [employer] to process any data obtained before it fell off.

## 2020-10-26 NOTE — Telephone Encounter (Signed)
Ticket # [id number] Pamala Hurry is calling stating they spoke with the patient and she is sending her monitor back in. In order to send a new one a new order will need to be placed. Please advise.

## 2020-10-31 DIAGNOSIS — I493 Ventricular premature depolarization: Secondary | ICD-10-CM | POA: Diagnosis not present

## 2020-11-22 DIAGNOSIS — I493 Ventricular premature depolarization: Secondary | ICD-10-CM | POA: Diagnosis not present

## 2020-11-28 ENCOUNTER — Telehealth: Payer: Self-pay | Admitting: Internal Medicine

## 2020-11-28 NOTE — Telephone Encounter (Signed)
RN called pt back, reviewed results and MD recommendations.

## 2020-11-28 NOTE — Telephone Encounter (Signed)
Jody Garrett is returning Shamea's call in regards to her heart monitor results.

## 2020-11-30 ENCOUNTER — Ambulatory Visit (INDEPENDENT_AMBULATORY_CARE_PROVIDER_SITE_OTHER): Payer: Medicare Other | Admitting: Internal Medicine

## 2020-11-30 ENCOUNTER — Encounter: Payer: Self-pay | Admitting: Internal Medicine

## 2020-11-30 ENCOUNTER — Other Ambulatory Visit: Payer: Self-pay

## 2020-11-30 VITALS — BP 140/70 | HR 83 | Ht 65.0 in | Wt 190.0 lb

## 2020-11-30 DIAGNOSIS — I10 Essential (primary) hypertension: Secondary | ICD-10-CM

## 2020-11-30 DIAGNOSIS — E785 Hyperlipidemia, unspecified: Secondary | ICD-10-CM

## 2020-11-30 DIAGNOSIS — I493 Ventricular premature depolarization: Secondary | ICD-10-CM | POA: Diagnosis not present

## 2020-11-30 DIAGNOSIS — I340 Nonrheumatic mitral (valve) insufficiency: Secondary | ICD-10-CM

## 2020-11-30 MED ORDER — METOPROLOL SUCCINATE ER 100 MG PO TB24: 100.0000 mg | ORAL_TABLET | Freq: Every day | ORAL | 3 refills | Status: DC

## 2020-11-30 NOTE — Patient Instructions (Signed)
Medication Instructions:  Your physician has recommended you make the following change in your medication:  INCREASE: metoprolol succinate (Toprol XL) to 100 mg by mouth daily  *If you need a refill on your cardiac medications before your next appointment, please call your pharmacy*   Lab Work: NONE If you have labs (blood work) drawn today and your tests are completely normal, you will receive your results only by: [place] (if you have MyChart) OR A paper copy in the mail If you have any lab test that is abnormal or we need to change your treatment, we will call you to review the results.   Testing/Procedures: NONE   Follow-Up: At [facility], you and your health needs are our priority.  As part of our continuing mission to provide you with exceptional heart care, we have created designated Provider Care Teams.  These Care Teams include your primary Cardiologist (physician) and Advanced Practice Providers (APPs -  Physician Assistants and Nurse Practitioners) who all work together to provide you with the care you need, when you need it.   Your next appointment:   12 month(s)  The format for your next appointment:   In Person  Provider:   You may see Werner Lean, MD or one of the following Advanced Practice Providers on your designated Care Team:   Melina Copa, PA-C Ermalinda Barrios, PA-C   Other Instructions Please perform ambulatory blood pressure checks

## 2020-11-30 NOTE — Progress Notes (Signed)
Cardiology Office Note:    Date:  11/30/2020   ID:  Moss Mc, DOB February 02, 1951, MRN [id number]  PCP:  Lavone Orn, [place]  Cardiologist:  Werner Lean, MD  Advanced Practice Provider:  No care team member to display Electrophysiologist:  None      CC: Frequent PVC f/u  History of Present Illness:    Jody Garrett is a 70 y.o. female with a hx of asthma, Elevated BP, HLD, MVP who presents for evaluation 05/27/20.  Found to have high burden of PVCs  Stress CMR showed no scar, change in EF, or evidence of ischemia.  Heart monitor with no significant change in PVCs. Seen 11/30/20.  Patient notes that she is doing well. Got COVID-19 in July but has since recovered. No chest pain or pressure .  No SOB/DOE and no PND/Orthopnea.  No weight gain or leg swelling.  No syncope.  Feels significantly less palpitations on metoprolol.   Ambulatory blood pressure: 136/86.  Past Medical History:  Diagnosis Date   Arthritis    Asthma    as child   Fibromyalgia    GERD (gastroesophageal reflux disease)    Neuromuscular disorder ([place])    headaches    Past Surgical History:  Procedure Laterality Date   ABDOMINAL HYSTERECTOMY     AUGMENTATION MAMMAPLASTY Bilateral    has had implants removed   COLONOSCOPY WITH PROPOFOL N/A 07/12/2015   Procedure: COLONOSCOPY WITH PROPOFOL;  Surgeon: Garlan Fair, MD;  Location: [facility];  Service: Endoscopy;  Laterality: N/A;   TONSILLECTOMY     TUBAL LIGATION      Current Medications: Current Meds  Medication Sig   ALPRAZolam (XANAX) 0.25 MG tablet Take 0.25 mg by mouth daily.   atorvastatin (LIPITOR) 20 MG tablet Take 20 mg by mouth daily.   Calcium Citrate-Vitamin D (CALCIUM CITRATE+D3 PO) Take 1 tablet by mouth 2 (two) times daily.   Cholecalciferol (VITAMIN D3) 10000 units TABS Take 1,000 Units by mouth daily.   cyclobenzaprine (FLEXERIL) 10 MG tablet Take 10 mg by mouth 3  (three) times daily as needed for muscle spasms.   diphenhydrAMINE-APAP, sleep, (TYLENOL PM EXTRA STRENGTH PO) Take by mouth as needed.   esomeprazole (NEXIUM) 20 MG capsule Take 20 mg by mouth daily.    ibuprofen (ADVIL,MOTRIN) 200 MG tablet Take 800 mg by mouth every 6 (six) hours as needed (For pain.).   metoprolol succinate (TOPROL-XL) 100 MG 24 hr tablet Take 1 tablet (100 mg total) by mouth daily. Take with or immediately following a meal.   neomycin-bacitracin-polymyxin (NEOSPORIN) ointment Apply 1 application topically as needed for wound care. apply to eye   OVER THE COUNTER MEDICATION Place 2 drops into both eyes 4 (four) times daily as needed (For eye redness or irritation.). [employer] Advanced Eye Relief Maximum Redness Eye Drops   venlafaxine XR (EFFEXOR-XR) 75 MG 24 hr capsule Take 150 mg by mouth at bedtime.   VIVELLE-DOT 0.1 MG/24HR patch Place 1 patch onto the skin every Wednesday and Saturday.    [DISCONTINUED] metoprolol succinate (TOPROL-XL) 50 MG 24 hr tablet Take 1 tablet (50 mg total) by mouth daily. Take with or immediately following a meal.     Allergies:   Patient has no allergy information on record.   Social History   Socioeconomic History   Marital status: Married    Spouse name: Not on file   Number of children: Not on file  Years of education: Not on file   Highest education level: Not on file  Occupational History   Not on file  Tobacco Use   Smoking status: Former    Types: Cigarettes    Quit date: 07/04/2000    Years since quitting: 20.4   Smokeless tobacco: Never  Vaping Use   Vaping Use: Never used  Substance and Sexual Activity   Alcohol use: No   Drug use: No   Sexual activity: Not on file  Other Topics Concern   Not on file  Social History Narrative   Not on file   Social Determinants of Health   Financial Resource Strain: Not on file  Food Insecurity: Not on file  Transportation Needs: Not on file  Physical Activity: Not on file   Stress: Not on file  Social Connections: Not on file    Social: Brother in law recently passed away (cancer)  Family History: History of coronary artery disease notable for grandparents. History of heart failure notable for grandmother. No heart valve problems History of arrhythmia notable for no members.  ROS:   Please see the history of present illness.     All other systems reviewed and are negative.  EKGs/Labs/Other Studies Reviewed:    following studies were reviewed today:  EKG:   11/30/20:  Srrate 83 with monomorphic outflow tract PVCs in the pattern of trigeminy 05/27/20: Frequent PVCs  Cardiac Event Monitoring: Date: 11/22/20 Results: Patient had a minimum heart rate of 63 bpm, maximum heart rate of 193 bpm (short run of SVT), and average heart rate of 86 bpm. Predominant underlying rhythm was sinus rhythm. One short run of supraventricular tachycardia occurred lasting 5 beats at longest with a max rate of 193 bpm at fastest. Isolated PACs were rare (<1.0%). Isolated PVCs were frequent (31%). Triggered and diary events associated with sinus rhythm and PVCs. Study over two monitors.   Frequent PVCs  Transthoracic Echocardiogram: Date: 05/27/20 Results: Mild MVP  1. Left ventricular ejection fraction, by estimation, is 55 to 60%. The  left ventricle has normal function. The left ventricle has no regional  wall motion abnormalities. There is mild left ventricular hypertrophy.  Left ventricular diastolic parameters  are consistent with Grade I diastolic dysfunction (impaired relaxation).   2. Right ventricular systolic function is normal. The right ventricular  size is normal.   3. The mitral valve is normal in structure. Mild mitral valve  regurgitation. No evidence of mitral stenosis.   4. The aortic valve is tricuspid. Aortic valve regurgitation is not  visualized. Mild aortic valve sclerosis is present, with no evidence of  aortic valve stenosis.    MRI  Stress Testing : Date: 08/26/20 Results: IMPRESSION: 1.  No evidence on ischemia on stress perfusion imaging   2.  No late gadolinium enhancement to suggest myocardial scar   3.  Normal LV size and systolic function (EF 54%)   4.  Normal RV size and systolic function (EF 62%)    Recent Labs: 08/22/2020: BUN 8; Creatinine, Ser 0.80; Hemoglobin 12.8; Platelets 203; Potassium 4.7; Sodium 138  Recent Lipid Panel No results found for: CHOL, TRIG, HDL, CHOLHDL, VLDL, LDLCALC, LDLDIRECT   Outside Hospital  or Outside Clinic Studies (OS[facility]):  Date: 11/27/19 Cholesterol 140 HDL 50 LDL 64 Tgs 151 04/19/20 Creatinine 0.81 TSH 3.44 BNP NA   Physical Exam:    VS:  BP 140/70   Pulse 83   Ht 5\' 5"  (1.651 m)   Wt 190 lb (86.2  kg)   SpO2 96%   BMI 31.62 kg/m     Wt Readings from Last 3 Encounters:  11/30/20 190 lb (86.2 kg)  05/27/20 189 lb (85.7 kg)  07/12/15 160 lb (72.6 kg)    GEN:  Well nourished, well developed in no acute distress HEENT: Normal NECK: No JVD LYMPHATICS: No lymphadenopathy CARDIAC: irregularly regular hear beats (likely bigeminy), no murmurs, rubs, gallops RESPIRATORY:  Clear to auscultation without rales, wheezing or rhonchi  ABDOMEN: Soft, non-tender, non-distended MUSCULOSKELETAL:  No edema; No deformity  SKIN: Warm and dry NEUROLOGIC:  Alert and oriented x 3 PSYCHIATRIC:  Normal affect   ASSESSMENT:    1. PVC (premature ventricular contraction)   2. Essential hypertension   3. Nonrheumatic mitral valve regurgitation   4. Hyperlipidemia, unspecified hyperlipidemia type     PLAN:    In order of problems listed above:  Frequent PVCs HTN - continue ambulatory BP - will trial increase metoprolol succinate to 100 mg PO Daily; if fatigue will drop down to 75 mg PO daily  MVP and Mild MR - Echo in 3 years unless new findings  HLD - controlled on statin  Will plan for one year follow up unless new symptoms or abnormal test results warranting  change in plan  Would be reasonable for  APP Follow up    Medication Adjustments/Labs and Tests Ordered: Current medicines are reviewed at length with the patient today.  Concerns regarding medicines are outlined above.  Orders Placed This Encounter  Procedures   EKG 12-Lead    Meds ordered this encounter  Medications   metoprolol succinate (TOPROL-XL) 100 MG 24 hr tablet    Sig: Take 1 tablet (100 mg total) by mouth daily. Take with or immediately following a meal.    Dispense:  90 tablet    Refill:  3     Patient Instructions  Medication Instructions:  Your physician has recommended you make the following change in your medication:  INCREASE: metoprolol succinate (Toprol XL) to 100 mg by mouth daily  *If you need a refill on your cardiac medications before your next appointment, please call your pharmacy*   Lab Work: NONE If you have labs (blood work) drawn today and your tests are completely normal, you will receive your results only by: [place] (if you have MyChart) OR A paper copy in the mail If you have any lab test that is abnormal or we need to change your treatment, we will call you to review the results.   Testing/Procedures: NONE   Follow-Up: At [facility], you and your health needs are our priority.  As part of our continuing mission to provide you with exceptional heart care, we have created designated Provider Care Teams.  These Care Teams include your primary Cardiologist (physician) and Advanced Practice Providers (APPs -  Physician Assistants and Nurse Practitioners) who all work together to provide you with the care you need, when you need it.   Your next appointment:   12 month(s)  The format for your next appointment:   In Person  Provider:   You may see Werner Lean, MD or one of the following Advanced Practice Providers on your designated Care Team:   Melina Copa, PA-C Ermalinda Barrios, PA-C   Other Instructions Please  perform ambulatory blood pressure checks    Signed, Werner Lean, MD  11/30/2020 2:38 PM    [place]

## 2021-02-10 DIAGNOSIS — I10 Essential (primary) hypertension: Secondary | ICD-10-CM | POA: Diagnosis not present

## 2021-02-10 DIAGNOSIS — K219 Gastro-esophageal reflux disease without esophagitis: Secondary | ICD-10-CM | POA: Diagnosis not present

## 2021-02-10 DIAGNOSIS — Z86010 Personal history of colonic polyps: Secondary | ICD-10-CM | POA: Diagnosis not present

## 2021-02-10 DIAGNOSIS — F410 Panic disorder [episodic paroxysmal anxiety] without agoraphobia: Secondary | ICD-10-CM | POA: Diagnosis not present

## 2021-02-10 DIAGNOSIS — Z0000 Encounter for general adult medical examination without abnormal findings: Secondary | ICD-10-CM | POA: Diagnosis not present

## 2021-02-10 DIAGNOSIS — Z23 Encounter for immunization: Secondary | ICD-10-CM | POA: Diagnosis not present

## 2021-02-10 DIAGNOSIS — M797 Fibromyalgia: Secondary | ICD-10-CM | POA: Diagnosis not present

## 2021-02-10 DIAGNOSIS — E7800 Pure hypercholesterolemia, unspecified: Secondary | ICD-10-CM | POA: Diagnosis not present

## 2021-02-10 DIAGNOSIS — Z1389 Encounter for screening for other disorder: Secondary | ICD-10-CM | POA: Diagnosis not present

## 2021-02-10 DIAGNOSIS — I493 Ventricular premature depolarization: Secondary | ICD-10-CM | POA: Diagnosis not present

## 2021-04-04 DIAGNOSIS — H2513 Age-related nuclear cataract, bilateral: Secondary | ICD-10-CM | POA: Diagnosis not present

## 2021-04-04 DIAGNOSIS — H3500 Unspecified background retinopathy: Secondary | ICD-10-CM | POA: Diagnosis not present

## 2021-04-04 DIAGNOSIS — H40013 Open angle with borderline findings, low risk, bilateral: Secondary | ICD-10-CM | POA: Diagnosis not present

## 2021-04-04 DIAGNOSIS — H5213 Myopia, bilateral: Secondary | ICD-10-CM | POA: Diagnosis not present

## 2021-07-11 IMAGING — MR MR CARDIAC FOR FUNCTION WO/W CM
45 of 48 series · 45 of 48 positions shown · non-contrast
Comparison: none

CLINICAL DATA: Stress FLOJHAR to evaluate for ischemia

[Series 5: t2_haste_db_tra_bh · axial · 8.0mm · 1.41mm/px · 1 of 16 slices shown]
[im 1/16]
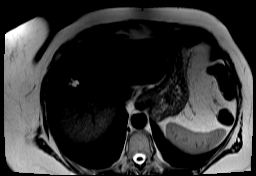

[Series 9: bSSFP · axial · 6.0mm · 1.41mm/px · 1 of 25 slices shown (1 of 18)]
[im 1/25]
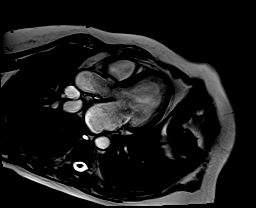

[Series 10: bSSFP · sagittal · 6.0mm · 1.41mm/px · 1 of 25 slices shown (2 of 18)]
[im 1/25]
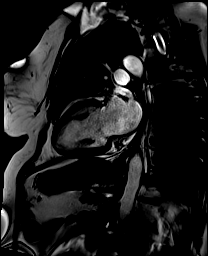

[Series 11: bSSFP · axial · 6.0mm · 1.41mm/px · 1 of 25 slices shown (3 of 18)]
[im 1/25]
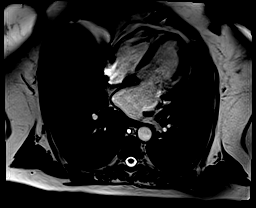

[Series 12: (id)_long_t1 · oblique · 8.0mm · 1.56mm/px · 1 of 24 slices shown]
[im 1/24]
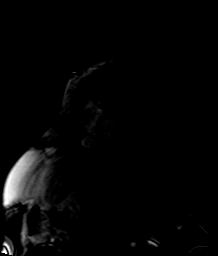

[Series 13: (id)_long_t1_moco · oblique · 8.0mm · 1.56mm/px · 1 of 24 slices shown]
[im 1/24]
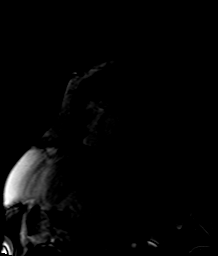

[Series 16: (id)_trufi · oblique · 8.0mm · 2.08mm/px · 1 of 9 slices shown]
[im 1/9]
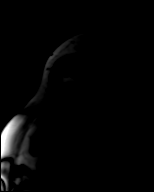

[Series 17: (id)_trufi_moco · oblique · 8.0mm · 2.08mm/px · 1 of 9 slices shown]
[im 1/9]
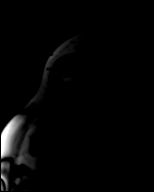

[Series 20: pre short axis · oblique · non-contrast · 8.0mm · 2.25mm/px · 1 of 10 slices shown (1 of 6)]
[im 1/10]
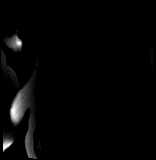

[Series 21: pre short axis · oblique · non-contrast · 8.0mm · 2.25mm/px · 1 of 10 slices shown (2 of 6)]
[im 1/10]
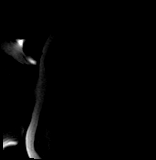

[Series 22: pre short axis · oblique · non-contrast · 8.0mm · 2.25mm/px · 1 of 10 slices shown (3 of 6)]
[im 1/10]
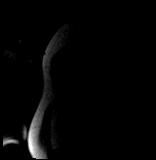

[Series 23: pre short axis · oblique · non-contrast · 8.0mm · 2.25mm/px · 1 of 10 slices shown (4 of 6)]
[im 1/10]
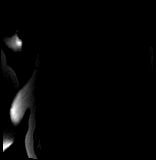

[Series 24: pre short axis · oblique · non-contrast · 8.0mm · 2.25mm/px · 1 of 10 slices shown (5 of 6)]
[im 1/10]
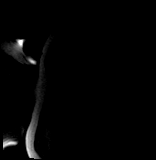

[Series 25: pre short axis · oblique · non-contrast · 8.0mm · 2.25mm/px · 1 of 10 slices shown (6 of 6)]
[im 1/10]
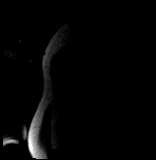

[Series 26: stress short axis · oblique · 8.0mm · 2.25mm/px · 1 of 60 slices shown (1 of 6)]
[im 1/60]
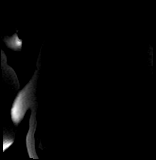

[Series 27: stress short axis · oblique · 8.0mm · 2.25mm/px · 1 of 60 slices shown (2 of 6)]
[im 1/60]
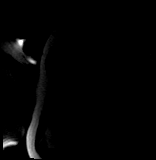

[Series 28: stress short axis · oblique · 8.0mm · 2.25mm/px · 1 of 60 slices shown (3 of 6)]
[im 1/60]
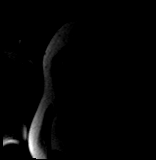

[Series 29: stress short axis · oblique · 8.0mm · 2.25mm/px · 1 of 60 slices shown (4 of 6)]
[im 1/60]
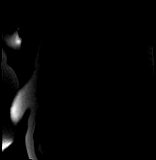

[Series 30: stress short axis · oblique · 8.0mm · 2.25mm/px · 1 of 60 slices shown (5 of 6)]
[im 1/60]
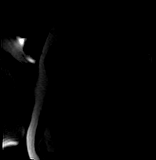

[Series 31: stress short axis · oblique · 8.0mm · 2.25mm/px · 1 of 60 slices shown (6 of 6)]
[im 1/60]
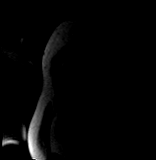

[Series 32: bSSFP · oblique · 8.0mm · 1.61mm/px · 1 of 25 slices shown (4 of 18)]
[im 1/25]
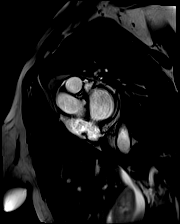

[Series 33: bSSFP · oblique · 8.0mm · 1.61mm/px · 1 of 25 slices shown (5 of 18)]
[im 1/25]
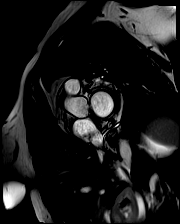

[Series 34: bSSFP · oblique · 8.0mm · 1.61mm/px · 1 of 25 slices shown (6 of 18)]
[im 1/25]
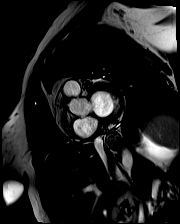

[Series 35: bSSFP · oblique · 8.0mm · 1.61mm/px · 1 of 25 slices shown (7 of 18)]
[im 1/25]
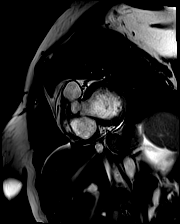

[Series 36: bSSFP · oblique · 8.0mm · 1.61mm/px · 1 of 25 slices shown (8 of 18)]
[im 1/25]
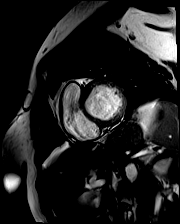

[Series 37: bSSFP · oblique · 8.0mm · 1.61mm/px · 1 of 25 slices shown (9 of 18)]
[im 1/25]
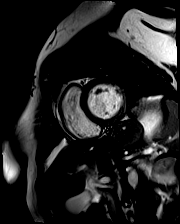

[Series 38: bSSFP · oblique · 8.0mm · 1.61mm/px · 1 of 25 slices shown (10 of 18)]
[im 1/25]
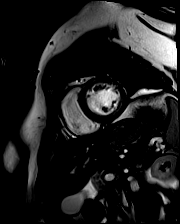

[Series 39: bSSFP · oblique · 8.0mm · 1.61mm/px · 1 of 25 slices shown (11 of 18)]
[im 1/25]
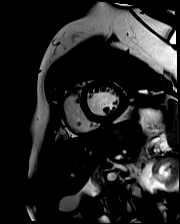

[Series 40: bSSFP · oblique · 8.0mm · 1.61mm/px · 1 of 25 slices shown (12 of 18)]
[im 1/25]
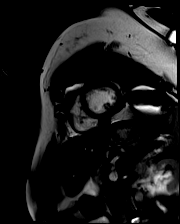

[Series 41: bSSFP · oblique · 8.0mm · 1.61mm/px · 1 of 25 slices shown (13 of 18)]
[im 1/25]
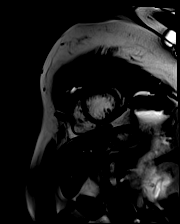

[Series 42: bSSFP · oblique · 8.0mm · 1.61mm/px · 1 of 25 slices shown (14 of 18)]
[im 1/25]
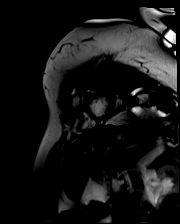

[Series 43: bSSFP · oblique · 8.0mm · 1.61mm/px · 1 of 25 slices shown (15 of 18)]
[im 1/25]
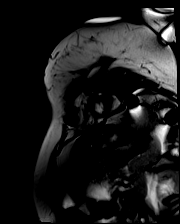

[Series 44: bSSFP · oblique · 8.0mm · 1.61mm/px · 1 of 25 slices shown (16 of 18)]
[im 1/25]
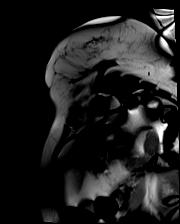

[Series 45: bSSFP · oblique · 8.0mm · 1.61mm/px · 1 of 25 slices shown (17 of 18)]
[im 1/25]
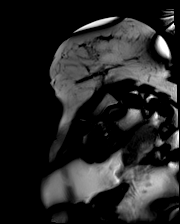

[Series 46: rest short axis · oblique · 8.0mm · 2.25mm/px · 1 of 60 slices shown (1 of 6)]
[im 1/60]
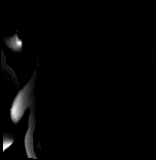

[Series 47: rest short axis · oblique · 8.0mm · 2.25mm/px · 1 of 60 slices shown (2 of 6)]
[im 1/60]
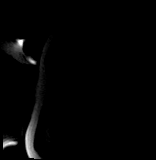

[Series 48: rest short axis · oblique · 8.0mm · 2.25mm/px · 1 of 60 slices shown (3 of 6)]
[im 1/60]
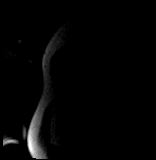

[Series 49: rest short axis · oblique · 8.0mm · 2.25mm/px · 1 of 60 slices shown (4 of 6)]
[im 1/60]
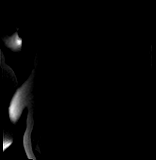

[Series 50: rest short axis · oblique · 8.0mm · 2.25mm/px · 1 of 60 slices shown (5 of 6)]
[im 1/60]
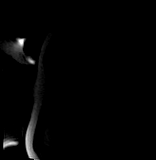

[Series 51: rest short axis · oblique · 8.0mm · 2.25mm/px · 1 of 60 slices shown (6 of 6)]
[im 1/60]
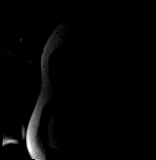

[Series 52: bSSFP · coronal · 6.0mm · 1.41mm/px · 1 of 25 slices shown (18 of 18)]
[im 1/25]
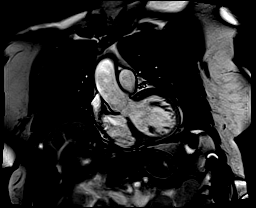

[Series 53: cine rvit · oblique · 6.0mm · 1.41mm/px · 1 of 25 slices shown]
[im 1/25]
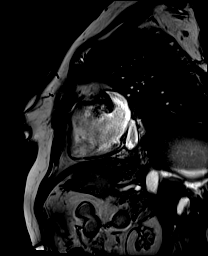

[Series 54: aortic valve cine · sagittal · 6.0mm · 1.41mm/px · 1 of 25 slices shown (1 of 2)]
[im 1/25]
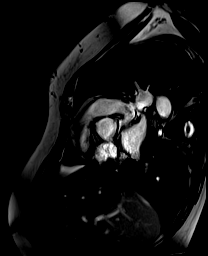

[Series 55: cine rvot · sagittal · 6.0mm · 1.41mm/px · 1 of 25 slices shown]
[im 1/25]
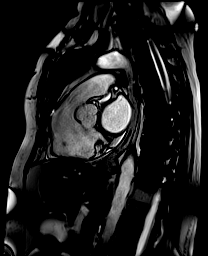

[Series 56: aortic valve cine · oblique · 6.0mm · 1.41mm/px · 1 of 25 slices shown (2 of 2)]
[im 1/25]
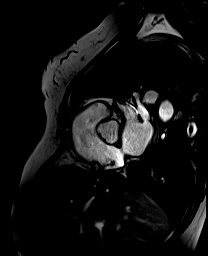

[45 of 48 positions shown; findings below may reference images not displayed]

EXAM:
CARDIAC MRI

PROCEDURE:
PROCEDURE
The patient was scanned on a 1.5 Tesla Siemens magnet. A dedicated
cardiac coil was used. Functional imaging was done using [employer]
sequences. [DATE], and 4 chamber views were done to assess for RWMA's.
Modified Chann rule using a short axis stack was used to
calculate an ejection fraction on a dedicated work station using

[employer] software. Rest and stress (following administration of
regadenoson 0.4mg) first-pass contrast-enhanced imaging was done.
The patient received 10 cc of Gadavist. After 10 minutes inversion
recovery sequences were used to assess for infiltration and scar
tissue.

CONTRAST:  10 cc  of Gadavist
FINDINGS: Left ventricle:

-Normal size

-Normal systolic function

-Normal ECV (28%)

-Normal stress perfusion

-No LGE

LV EF:  60% (Normal 56-78%)

Absolute volumes:

LV EDV: 147mL (Normal 52-141 mL)

LV ESV: 59mL (Normal 13-51 mL)

LV SV: 88mL (Normal 33-97 mL)

CO: 8.2L/min (Normal 2.7-6.0 L/min)

Indexed volumes:

LV EDV: 74mL/sq-m (Normal 41-81 mL/sq-m)

LV ESV: 30mL/sq-m (Normal 12-21 mL/sq-m)

LV SV: 45mL/sq-m (Normal 26-56 mL/sq-m)

CI: 4.1L/min/sq-m (Normal 1.8-3.8 L/min/sq-m)

Right ventricle: Normal size and systolic function

RV EF: 68% (Normal 47-80%)

Absolute volumes:

RV EDV: 111mL (Normal 58-154 mL)

RV ESV: 35mL (Normal 12-68 mL)

RV SV: 76mL (Normal 35-98 mL)

CO: 7.1L/min (Normal 2.7-6 L/min)

Indexed volumes:

RV EDV: 56mL/sq-m (Normal 48-87 mL/sq-m)

RV ESV: 18mL/sq-m (Normal 11-28 mL/sq-m)

RV SV: 38mL/sq-m (Normal 27-57 mL/sq-m)

CI: 3.6L/min/sq-m (Normal 1.8-3.8 L/min/sq-m)

Left atrium: Mild enlargement

Right atrium: Normal size

Mitral valve: Mild regurgitation

Aortic valve: No regurgitation

Tricuspid valve: Trivial regurgitation

Pulmonic valve: No regurgitation

Aorta: Normal proximal ascending aorta

Pericardium: Normal
IMPRESSION: 1.  No evidence on ischemia on stress perfusion imaging

2.  No late gadolinium enhancement to suggest myocardial scar

3.  Normal LV size and systolic function (EF 60%)

4.  Normal RV size and systolic function (EF 68%)

## 2021-11-20 ENCOUNTER — Other Ambulatory Visit: Payer: Self-pay | Admitting: Internal Medicine

## 2021-11-21 ENCOUNTER — Telehealth: Payer: Self-pay | Admitting: Internal Medicine

## 2021-11-21 MED ORDER — METOPROLOL SUCCINATE ER 100 MG PO TB24: 100.0000 mg | ORAL_TABLET | Freq: Every day | ORAL | 0 refills | Status: DC

## 2021-11-21 NOTE — Telephone Encounter (Signed)
Pt's medication was sent to pt's pharmacy as requested. Confirmation received.  °

## 2021-11-21 NOTE — Telephone Encounter (Signed)
*  STAT* If patient is at the pharmacy, call can be transferred to refill team.   1. Which medications need to be refilled? (please list name of each medication and dose if known)   metoprolol succinate (TOPROL-XL) 100 MG 24 hr tablet   2. Which pharmacy/location (including street and city if local pharmacy) is medication to be sent to? [place], [place] - [address] AT [place]  3. Do they need a 30 day or 90 day supply?  90 day   Pt has scheduled appt on 11/23/21. Please advise

## 2021-11-22 NOTE — Progress Notes (Incomplete)
Cardiology Office Note:    Date:  11/23/2021   ID:  Jody Garrett, DOB 12-30-1950, MRN [id number]  PCP:  Lavone Orn, MD   [place] [facility] Cardiologist:  Werner Lean, MD     Referring MD: Lavone Orn, MD   Chief Complaint: annual follow-up PVCs  History of Present Illness:    Jody Garrett is a very pleasant 71 y.o. female with a hx of asthma, HLD, elevated BP, MVP, burden of PVCs, mild MR  Referred to cardiology by PCP for evaluation of chest pain and initially seen by Dr. Glenford Bayley 05/27/2020.  She reported chest pressure with elevated BP, feeling funny heartbeats not every day but specifically when she was anxious.  Numbers improved on metoprolol. Only drinks decaf. BP was elevated 160-170 prior to metoprolol, now 120s-130s. EKG showed frequent PVCs.  Echo 05/27/2020 revealed normal LVEF 55 to 60%, mild LVH, G1 DD, mild mitral valve regurgitation, no mitral valve prolapse.  Monitor revealed frequent PVCs on metoprolol.  Metoprolol was increased to 50 mg daily and stress cardiac MR was obtained. CMR revealed no evidence of blockage or LGE, no scar, no evidence of ischemia.  Cardiac monitor repeated which showed no significant change in PVCs.  She was last seen in our office on 11/30/2020 by Dr. Glenford Bayley.  PVCs were managed with metoprolol succinate 100 mg daily with agreement to decrease to 75 mg daily with fatigue, plan to repeat echo in 3 years unless clinically indicated sooner.  She was advised to return for follow-up in 1 year.  Today, she is here alone for follow-up. Reports she rarely feels palpitations. Reports they were very symptomatic prior to starting metoprolol. Has completely eliminated caffeine. Rides recumbent bike daily for exercise and no increase in palpitations with exercise. She denies chest pain, shortness of breath, lower extremity edema, fatigue, melena, hematuria, hemoptysis, diaphoresis, weakness, presyncope, syncope,  orthopnea, and PND.  Past Medical History:  Diagnosis Date   Arthritis    Asthma    as child   Fibromyalgia    GERD (gastroesophageal reflux disease)    Neuromuscular disorder ([place])    headaches    Past Surgical History:  Procedure Laterality Date   ABDOMINAL HYSTERECTOMY     AUGMENTATION MAMMAPLASTY Bilateral    has had implants removed   COLONOSCOPY WITH PROPOFOL N/A 07/12/2015   Procedure: COLONOSCOPY WITH PROPOFOL;  Surgeon: Garlan Fair, MD;  Location: [facility];  Service: Endoscopy;  Laterality: N/A;   TONSILLECTOMY     TUBAL LIGATION      Current Medications: Current Meds  Medication Sig   ALPRAZolam (XANAX) 0.25 MG tablet Take 0.25 mg by mouth daily.   atorvastatin (LIPITOR) 20 MG tablet Take 20 mg by mouth daily.   Calcium Citrate-Vitamin D (CALCIUM CITRATE+D3 PO) Take 1 tablet by mouth 2 (two) times daily.   Cholecalciferol (VITAMIN D3) 10000 units TABS Take 1,000 Units by mouth daily.   cyclobenzaprine (FLEXERIL) 10 MG tablet Take 10 mg by mouth 3 (three) times daily as needed for muscle spasms.   diphenhydrAMINE-APAP, sleep, (TYLENOL PM EXTRA STRENGTH PO) Take by mouth as needed.   esomeprazole (NEXIUM) 20 MG capsule Take 20 mg by mouth daily.    ibuprofen (ADVIL,MOTRIN) 200 MG tablet Take 800 mg by mouth every 6 (six) hours as needed (For pain.).   metoprolol succinate (TOPROL-XL) 100 MG 24 hr tablet Take 1 tablet (100 mg total) by mouth daily. Take with or immediately following a meal.   metoprolol  succinate (TOPROL-XL) 50 MG 24 hr tablet Take 1 tablet (50 mg total) by mouth daily after breakfast. Take with or immediately following a meal.   neomycin-bacitracin-polymyxin (NEOSPORIN) ointment Apply 1 application topically as needed for wound care. apply to eye   OVER THE COUNTER MEDICATION Place 2 drops into both eyes 4 (four) times daily as needed (For eye redness or irritation.). [employer] Advanced Eye Relief Maximum Redness Eye Drops   venlafaxine XR  (EFFEXOR-XR) 75 MG 24 hr capsule Take 150 mg by mouth at bedtime.   VIVELLE-DOT 0.1 MG/24HR patch Place 1 patch onto the skin every Wednesday and Saturday.      Allergies:   Patient has no allergy information on record.   Social History   Socioeconomic History   Marital status: Married    Spouse name: Not on file   Number of children: Not on file   Years of education: Not on file   Highest education level: Not on file  Occupational History   Not on file  Tobacco Use   Smoking status: Former    Types: Cigarettes    Quit date: 07/04/2000    Years since quitting: 21.4   Smokeless tobacco: Never  Vaping Use   Vaping Use: Never used  Substance and Sexual Activity   Alcohol use: No   Drug use: No   Sexual activity: Not on file  Other Topics Concern   Not on file  Social History Narrative   Not on file   Social Determinants of Health   Financial Resource Strain: Not on file  Food Insecurity: Not on file  Transportation Needs: Not on file  Physical Activity: Not on file  Stress: Not on file  Social Connections: Not on file     Family History: The patient's family history includes Cerebral aneurysm in her mother; Colon cancer in her father; Hypercholesterolemia in her mother; Hypertension in her sister.  ROS:   Please see the history of present illness.  All other systems reviewed and are negative.  Labs/Other Studies Reviewed:    The following studies were reviewed today:  Cardiac Monitor 11/24/20  Patient had a minimum heart rate of 63 bpm, maximum heart rate of 193 bpm (short run of SVT), and average heart rate of 86 bpm. Predominant underlying rhythm was sinus rhythm. One short run of supraventricular tachycardia occurred lasting 5 beats at longest with a max rate of 193 bpm at fastest. Isolated PACs were rare (<1.0%). Isolated PVCs were frequent (31%). Triggered and diary events associated with sinus rhythm and PVCs. Study over two monitors.   Frequent PVCs     MR Cardiac Stress 08/26/20  1.  No evidence on ischemia on stress perfusion imaging   2.  No late gadolinium enhancement to suggest myocardial scar   3.  Normal LV size and systolic function (EF 81%)   4.  Normal RV size and systolic function (EF 19%)  Cardiac monitor 06/22/20  Patient had a minimum heart rate of 65 bpm, maximum heart rate of 127 bpm, and average heart rate of 82 bpm. Predominant underlying rhythm was sinus rhythm. Isolated PACs were rare (<1.0%), with rare couplets and triplets present. Isolated PVCs were frequent (29% [id number]), with rare couplets and triplets present. No evidence of complete heart block. No triggered and diary events.   Frequent PVCs  Echo 05/27/20  1. Left ventricular ejection fraction, by estimation, is 55 to 60%. The  left ventricle has normal function. The left ventricle has no  regional  wall motion abnormalities. There is mild left ventricular hypertrophy.  Left ventricular diastolic parameters  are consistent with Grade I diastolic dysfunction (impaired relaxation).   2. Right ventricular systolic function is normal. The right ventricular  size is normal.   3. The mitral valve is normal in structure. Mild mitral valve  regurgitation. No evidence of mitral stenosis.   4. The aortic valve is tricuspid. Aortic valve regurgitation is not  visualized. Mild aortic valve sclerosis is present, with no evidence of  aortic valve stenosis.   Recent Labs: No results found for requested labs within last 365 days.  Recent Lipid Panel No results found for: "CHOL", "TRIG", "HDL", "CHOLHDL", "VLDL", "LDLCALC", "LDLDIRECT"   Risk Assessment/Calculations:       Physical Exam:    VS:  BP 138/82   Pulse 78   Ht '5\' 5"'$  (1.651 m)   Wt 197 lb 12.8 oz (89.7 kg)   SpO2 96%   BMI 32.92 kg/m     Wt Readings from Last 3 Encounters:  11/23/21 197 lb 12.8 oz (89.7 kg)  11/30/20 190 lb (86.2 kg)  05/27/20 189 lb (85.7 kg)     GEN:  Well  nourished, well developed in no acute distress HEENT: Normal NECK: No JVD; No carotid bruits CARDIAC: Irregular RR. Soft systolic murmur. No rubs, gallops RESPIRATORY:  Clear to auscultation without rales, wheezing or rhonchi  ABDOMEN: Soft, non-tender, non-distended MUSCULOSKELETAL:  No edema; No deformity. 2+ pedal pulses, equalbilaterally SKIN: Warm and dry NEUROLOGIC:  Alert and oriented x 3 PSYCHIATRIC:  Normal affect   EKG:  EKG is ordered today.  The ekg ordered today demonstrates sinus rhythm at 78 bpm with frequent PVCs   Diagnoses:    1. Essential hypertension   2. PVC (premature ventricular contraction)   3. Nonrheumatic mitral valve regurgitation   4. Hyperlipidemia, unspecified hyperlipidemia type    Assessment and Plan:     Frequent PVCs: High PVC burden on two cardiac monitors April and September 2022 with no evidence of ischemia or structural heart disease. Frequent PVCs noted on EKG today and on auscultation. She is asymptomatic, however concerning that these continue to be frequent on high dose AV nodal therapy. Will increase metoprolol succinate to 150 mg daily - she will take 50 mg in the morning and 100 mg in the evening. Will have her return in 3 months for office visit to assess burden. Will check TSH today to ensure thyroid function is stable.   Mitral regurgitation: Mitral valve normal in structure, mild MR on echo 05/2020. Questions answered about previous diagnosis of MVP. She is asymptomatic. Plan at time of last echo to repeat in 3 years unless clinically indicated sooner. Will likely repeat echo prior to that time due to frequent PVC burden.   Hypertension: BP is well-controlled. Home SBP 254Y and diastolic typically less than 80 mmHg. We are increasing metoprolol dose for frequent PVCs.   Mixed hyperlipidemia: LDL 71, triglycerides 225 on 02/10/21.  Continue atorvastatin. Encouraged 150 minutes moderate intensity exercise each week and mostly plant-based,  whole food diet.     Disposition: 3 months with Dr. Gasper Sells  Medication Adjustments/Labs and Tests Ordered: Current medicines are reviewed at length with the patient today.  Concerns regarding medicines are outlined above.  Orders Placed This Encounter  Procedures   TSH   EKG 12-Lead   Meds ordered this encounter  Medications   metoprolol succinate (TOPROL-XL) 50 MG 24 hr tablet    Sig: Take  1 tablet (50 mg total) by mouth daily after breakfast. Take with or immediately following a meal.    Dispense:  90 tablet    Refill:  3    Patient Instructions  Medication Instructions:   START Toprol one ( 1) tablet by mouth ( 50 mg) in the am.   *If you need a refill on your cardiac medications before your next appointment, please call your pharmacy*   Lab Work:  TODAY!!! TSH  If you have labs (blood work) drawn today and your tests are completely normal, you will receive your results only by: [place] (if you have MyChart) OR A paper copy in the mail If you have any lab test that is abnormal or we need to change your treatment, we will call you to review the results.   Testing/Procedures:  None ordered.   Follow-Up: At [facility], you and your health needs are our priority.  As part of our continuing mission to provide you with exceptional heart care, we have created designated Provider Care Teams.  These Care Teams include your primary Cardiologist (physician) and Advanced Practice Providers (APPs -  Physician Assistants and Nurse Practitioners) who all work together to provide you with the care you need, when you need it.  We recommend signing up for the patient portal called "MyChart".  Sign up information is provided on this After Visit Summary.  MyChart is used to connect with patients for Virtual Visits (Telemedicine).  Patients are able to view lab/test results, encounter notes, upcoming appointments, etc.  Non-urgent messages can be sent to your  provider as well.   To learn more about what you can do with MyChart, go to [email or web address].    Your next appointment:   3 month(s)  The format for your next appointment:   In Person  Provider:   Werner Lean, MD     Important Information About Sugar         Signed, Emmaline Life, NP  11/23/2021 1:04 PM    [place]

## 2021-11-23 ENCOUNTER — Ambulatory Visit: Payer: Medicare Other | Attending: Nurse Practitioner | Admitting: Nurse Practitioner

## 2021-11-23 ENCOUNTER — Encounter: Payer: Self-pay | Admitting: Nurse Practitioner

## 2021-11-23 VITALS — BP 138/82 | HR 78 | Ht 65.0 in | Wt 197.8 lb

## 2021-11-23 DIAGNOSIS — I493 Ventricular premature depolarization: Secondary | ICD-10-CM | POA: Insufficient documentation

## 2021-11-23 DIAGNOSIS — E785 Hyperlipidemia, unspecified: Secondary | ICD-10-CM | POA: Insufficient documentation

## 2021-11-23 DIAGNOSIS — I10 Essential (primary) hypertension: Secondary | ICD-10-CM | POA: Diagnosis not present

## 2021-11-23 DIAGNOSIS — I340 Nonrheumatic mitral (valve) insufficiency: Secondary | ICD-10-CM | POA: Insufficient documentation

## 2021-11-23 LAB — TSH: TSH: 3.51 u[IU]/mL (ref 0.450–4.500)

## 2021-11-23 MED ORDER — METOPROLOL SUCCINATE ER 50 MG PO TB24: 50.0000 mg | ORAL_TABLET | Freq: Every day | ORAL | 3 refills | Status: DC

## 2021-11-23 NOTE — Patient Instructions (Signed)
Medication Instructions:   START Toprol one ( 1) tablet by mouth ( 50 mg) in the am.   *If you need a refill on your cardiac medications before your next appointment, please call your pharmacy*   Lab Work:  TODAY!!! TSH  If you have labs (blood work) drawn today and your tests are completely normal, you will receive your results only by: [place] (if you have MyChart) OR A paper copy in the mail If you have any lab test that is abnormal or we need to change your treatment, we will call you to review the results.   Testing/Procedures:  None ordered.   Follow-Up: At [facility], you and your health needs are our priority.  As part of our continuing mission to provide you with exceptional heart care, we have created designated Provider Care Teams.  These Care Teams include your primary Cardiologist (physician) and Advanced Practice Providers (APPs -  Physician Assistants and Nurse Practitioners) who all work together to provide you with the care you need, when you need it.  We recommend signing up for the patient portal called "MyChart".  Sign up information is provided on this After Visit Summary.  MyChart is used to connect with patients for Virtual Visits (Telemedicine).  Patients are able to view lab/test results, encounter notes, upcoming appointments, etc.  Non-urgent messages can be sent to your provider as well.   To learn more about what you can do with MyChart, go to [email or web address].    Your next appointment:   3 month(s)  The format for your next appointment:   In Person  Provider:   Werner Lean, MD     Important Information About Sugar

## 2021-11-27 ENCOUNTER — Other Ambulatory Visit: Payer: Self-pay | Admitting: *Deleted

## 2021-11-27 MED ORDER — METOPROLOL SUCCINATE ER 50 MG PO TB24: 50.0000 mg | ORAL_TABLET | Freq: Every day | ORAL | 3 refills | Status: DC

## 2021-11-27 MED ORDER — METOPROLOL SUCCINATE ER 100 MG PO TB24: 100.0000 mg | ORAL_TABLET | Freq: Every day | ORAL | 0 refills | Status: DC

## 2022-02-15 NOTE — Progress Notes (Signed)
Cardiology Office Note:    Date:  02/16/2022   ID:  Moss Mc, DOB [birth year], MRN [id number]  PCP:  Lavone Orn, [place]  Cardiologist:  Werner Lean, MD  Advanced Practice Provider:  No care team member to display Electrophysiologist:  None      CC: Frequent PVC f/u  History of Present Illness:    Jody Garrett is a 71 y.o. female with a hx of asthma, Elevated BP, HLD, MVP who presents for evaluation 05/27/20.  Found to have high burden of PVCs  Stress CMR showed no scar, change in EF, or evidence of ischemia.  Heart monitor with no significant change in PVCs. Seen 11/30/20.  2023: more symptomatic; BB was increased.  Patient notes that she is doing good.   Getting ready for the Christmas season. There are no interval hospital/ED visit.   Palpitations feed much better now- none on current therapy  No chest pain or pressure .  No SOB/DOE and no PND/Orthopnea.  No weight gain or leg swelling.  No fatigue or weakness.  BP at home is high 130s to 140s with normal diastolic   Past Medical History:  Diagnosis Date   Arthritis    Asthma    as child   Fibromyalgia    GERD (gastroesophageal reflux disease)    Neuromuscular disorder ([place])    headaches    Past Surgical History:  Procedure Laterality Date   ABDOMINAL HYSTERECTOMY     AUGMENTATION MAMMAPLASTY Bilateral    has had implants removed   COLONOSCOPY WITH PROPOFOL N/A 07/12/2015   Procedure: COLONOSCOPY WITH PROPOFOL;  Surgeon: Garlan Fair, MD;  Location: [facility];  Service: Endoscopy;  Laterality: N/A;   TONSILLECTOMY     TUBAL LIGATION      Current Medications: Current Meds  Medication Sig   ALPRAZolam (XANAX) 0.25 MG tablet Take 0.25 mg by mouth daily.   amLODipine (NORVASC) 2.5 MG tablet Take 1 tablet (2.5 mg total) by mouth daily.   atorvastatin (LIPITOR) 20 MG tablet Take 20 mg by mouth daily.   Calcium Citrate-Vitamin D (CALCIUM  CITRATE+D3 PO) Take 1 tablet by mouth 2 (two) times daily.   Cholecalciferol (VITAMIN D3) 10000 units TABS Take 1,000 Units by mouth daily.   cyclobenzaprine (FLEXERIL) 10 MG tablet Take 5 mg by mouth as needed for muscle spasms.   diphenhydrAMINE-APAP, sleep, (TYLENOL PM EXTRA STRENGTH PO) Take by mouth as needed.   esomeprazole (NEXIUM) 20 MG capsule Take 20 mg by mouth daily.    ibuprofen (ADVIL,MOTRIN) 200 MG tablet Take 800 mg by mouth every 6 (six) hours as needed (For pain.).   metoprolol succinate (TOPROL-XL) 100 MG 24 hr tablet Take 1 tablet (100 mg total) by mouth daily after breakfast. Take with or immediately following a meal.   metoprolol succinate (TOPROL-XL) 50 MG 24 hr tablet Take 1 tablet (50 mg total) by mouth daily before supper. Take with or immediately following a meal.   neomycin-bacitracin-polymyxin (NEOSPORIN) ointment Apply 1 application topically as needed for wound care. apply to eye   OVER THE COUNTER MEDICATION Place 2 drops into both eyes 4 (four) times daily as needed (For eye redness or irritation.). [employer] Advanced Eye Relief Maximum Redness Eye Drops   venlafaxine XR (EFFEXOR-XR) 75 MG 24 hr capsule Take 150 mg by mouth at bedtime.   VIVELLE-DOT 0.1 MG/24HR patch Place 1 patch onto the skin every Wednesday and Saturday.  Allergies:   Patient has no allergy information on record.   Social History   Socioeconomic History   Marital status: Married    Spouse name: Not on file   Number of children: Not on file   Years of education: Not on file   Highest education level: Not on file  Occupational History   Not on file  Tobacco Use   Smoking status: Former    Types: Cigarettes    Quit date: 07/04/2000    Years since quitting: 21.6   Smokeless tobacco: Never  Vaping Use   Vaping Use: Never used  Substance and Sexual Activity   Alcohol use: No   Drug use: No   Sexual activity: Not on file  Other Topics Concern   Not on file  Social History  Narrative   Not on file   Social Determinants of Health   Financial Resource Strain: Not on file  Food Insecurity: Not on file  Transportation Needs: Not on file  Physical Activity: Not on file  Stress: Not on file  Social Connections: Not on file    Social: Brother in law recently passed away 2020-06-08 (cancer)  Family History: History of coronary artery disease notable for grandparents. History of heart failure notable for grandmother. No heart valve problems History of arrhythmia notable for no members.  ROS:   Please see the history of present illness.     All other systems reviewed and are negative.  EKGs/Labs/Other Studies Reviewed:    following studies were reviewed today:  EKG:   11/30/20:  SR rate 83 with monomorphic outflow tract PVCs in the pattern of trigeminy 05/27/20: Frequent PVCs  Cardiac Studies & Procedures       ECHOCARDIOGRAM  ECHOCARDIOGRAM COMPLETE 05/27/2020  Narrative ECHOCARDIOGRAM REPORT    Patient Name:   Jody Garrett [facility] Date of Exam: 05/27/2020 Medical Rec #:  [id number]              Height:       65.0 in Accession #:    [id number]             Weight:       160.0 lb Date of Birth:  April 18, 1950              BSA:          1.799 m Patient Age:    64 years               BP:           -/- mmHg Patient Gender: F                      HR:           96 bpm. Exam Location:  [place]  Procedure: 2D Echo, Cardiac Doppler and Color Doppler  Indications:    I49.3 PVC  History:        Patient has no prior history of Echocardiogram examinations. Risk Factors:Hypertension and Dyslipidemia. Sinus tachycardia. Asthma. Fibromyalgia.  Sonographer:    Diamond Nickel RCS Referring Phys: [place]   1. Left ventricular ejection fraction, by estimation, is 55 to 60%. The left ventricle has normal function. The left ventricle has no regional wall motion abnormalities. There is mild left ventricular hypertrophy. Left ventricular  diastolic parameters are consistent with Grade I diastolic dysfunction (impaired relaxation). 2. Right ventricular systolic function is normal. The right ventricular size is normal. 3. The mitral valve is  normal in structure. Mild mitral valve regurgitation. No evidence of mitral stenosis. 4. The aortic valve is tricuspid. Aortic valve regurgitation is not visualized. Mild aortic valve sclerosis is present, with no evidence of aortic valve stenosis.  FINDINGS Left Ventricle: Left ventricular ejection fraction, by estimation, is 55 to 60%. The left ventricle has normal function. The left ventricle has no regional wall motion abnormalities. The left ventricular internal cavity size was normal in size. There is mild left ventricular hypertrophy. Left ventricular diastolic parameters are consistent with Grade I diastolic dysfunction (impaired relaxation).  Right Ventricle: The right ventricular size is normal. Right ventricular systolic function is normal.  Left Atrium: Left atrial size was normal in size.  Right Atrium: Right atrial size was normal in size.  Pericardium: There is no evidence of pericardial effusion.  Mitral Valve: The mitral valve is normal in structure. Mild mitral annular calcification. Mild mitral valve regurgitation. No evidence of mitral valve stenosis.  Tricuspid Valve: The tricuspid valve is normal in structure. Tricuspid valve regurgitation is trivial. No evidence of tricuspid stenosis.  Aortic Valve: The aortic valve is tricuspid. Aortic valve regurgitation is not visualized. Mild aortic valve sclerosis is present, with no evidence of aortic valve stenosis.  Pulmonic Valve: The pulmonic valve was normal in structure. Pulmonic valve regurgitation is trivial. No evidence of pulmonic stenosis.  Aorta: The aortic root is normal in size and structure.  Venous: The inferior vena cava was not well visualized.  LEFT VENTRICLE PLAX 2D LVIDd:         4.40 cm   Diastology LVIDs:         2.85 cm  LV e' medial:    5.26 cm/s LV PW:         1.55 cm  LV E/e' medial:  10.6 LV IVS:        1.20 cm  LV e' lateral:   7.22 cm/s LVOT diam:     2.15 cm  LV E/e' lateral: 7.8 LV SV:         67 LV SV Index:   37 LVOT Area:     3.63 cm   RIGHT VENTRICLE RV Basal diam:  2.10 cm RV S prime:     16.90 cm/s TAPSE (M-mode): 2.1 cm  LEFT ATRIUM             Index       RIGHT ATRIUM          Index LA diam:        4.50 cm 2.50 cm/m  RA Area:     9.65 cm LA Vol (A2C):   57.1 ml 31.74 ml/m RA Volume:   17.90 ml 9.95 ml/m LA Vol (A4C):   35.9 ml 19.95 ml/m LA Biplane Vol: 45.7 ml 25.40 ml/m AORTIC VALVE LVOT Vmax:   93.80 cm/s LVOT Vmean:  57.900 cm/s LVOT VTI:    0.185 m  AORTA Ao Root diam: 3.10 cm  MITRAL VALVE MV Area (PHT): 4.83 cm    SHUNTS MV Decel Time: 157 msec    Systemic VTI:  0.18 m MV E velocity: 55.97 cm/s  Systemic Diam: 2.15 cm MV A velocity: 84.27 cm/s MV E/A ratio:  0.66  Kirk Ruths MD Electronically signed by Kirk Ruths MD Signature Date/Time: 05/27/2020/3:09:10 PM    Final    MONITORS  LONG TERM MONITOR (3-14 DAYS) 11/24/2020  Narrative  Patient had a minimum heart rate of 63 bpm, maximum heart rate of 193 bpm (short run of SVT),  and average heart rate of 86 bpm.  Predominant underlying rhythm was sinus rhythm.  One short run of supraventricular tachycardia occurred lasting 5 beats at longest with a max rate of 193 bpm at fastest.  Isolated PACs were rare (<1.0%).  Isolated PVCs were frequent (31%).  Triggered and diary events associated with sinus rhythm and PVCs.  Study over two monitors.  Frequent PVCs            Recent Labs: 11/23/2021: TSH 3.510  Recent Lipid Panel No results found for: "CHOL", "TRIG", "HDL", "CHOLHDL", "VLDL", "LDLCALC", "LDLDIRECT"     Physical Exam:    VS:  BP 137/78   Pulse (!) 55   Ht '5\' 5"'$  (1.651 m)   Wt 193 lb (87.5 kg)   SpO2 95%   BMI 32.12 kg/m     Wt  Readings from Last 3 Encounters:  02/16/22 193 lb (87.5 kg)  11/23/21 197 lb 12.8 oz (89.7 kg)  11/30/20 190 lb (86.2 kg)    GEN:  Well nourished, well developed in no acute distress HEENT: Normal NECK: No JVD LYMPHATICS: No lymphadenopathy CARDIAC: Largely regular with occasional PVC, heart rate 60 on exam no murmurs, rubs, gallops RESPIRATORY:  Clear to auscultation without rales, wheezing or rhonchi  ABDOMEN: Soft, non-tender, non-distended MUSCULOSKELETAL:  trace edema; No deformity  SKIN: Warm and dry NEUROLOGIC:  Alert and oriented x 3 PSYCHIATRIC:  Normal affect   ASSESSMENT:    1. PVC (premature ventricular contraction)   2. Nonrheumatic mitral valve regurgitation   3. Hyperlipidemia, unspecified hyperlipidemia type   4. Essential hypertension     PLAN:    Frequent PVCs HTN - continue ambulatory BP - continue BB; has no sx with bradycardia - norvasc 2.5 mg   MVP and Mild MR - Echo in 3 years unless new findings  HLD - controlled on statin  One year me or APP    Medication Adjustments/Labs and Tests Ordered: Current medicines are reviewed at length with the patient today.  Concerns regarding medicines are outlined above.  No orders of the defined types were placed in this encounter.   Meds ordered this encounter  Medications   amLODipine (NORVASC) 2.5 MG tablet    Sig: Take 1 tablet (2.5 mg total) by mouth daily.    Dispense:  90 tablet    Refill:  3     There are no Patient Instructions on file for this visit.   Signed, Werner Lean, MD  02/16/2022 1:38 PM    [place]

## 2022-02-16 ENCOUNTER — Encounter: Payer: Self-pay | Admitting: Internal Medicine

## 2022-02-16 ENCOUNTER — Ambulatory Visit: Payer: Medicare Other | Attending: Internal Medicine | Admitting: Internal Medicine

## 2022-02-16 VITALS — BP 137/78 | HR 55 | Ht 65.0 in | Wt 193.0 lb

## 2022-02-16 DIAGNOSIS — E785 Hyperlipidemia, unspecified: Secondary | ICD-10-CM | POA: Diagnosis not present

## 2022-02-16 DIAGNOSIS — I340 Nonrheumatic mitral (valve) insufficiency: Secondary | ICD-10-CM | POA: Diagnosis not present

## 2022-02-16 DIAGNOSIS — I493 Ventricular premature depolarization: Secondary | ICD-10-CM

## 2022-02-16 DIAGNOSIS — I10 Essential (primary) hypertension: Secondary | ICD-10-CM | POA: Diagnosis not present

## 2022-02-16 MED ORDER — AMLODIPINE BESYLATE 2.5 MG PO TABS: 2.5000 mg | ORAL_TABLET | Freq: Every day | ORAL | 3 refills | Status: DC

## 2022-02-16 NOTE — Patient Instructions (Signed)
Medication Instructions:  Your physician has recommended you make the following change in your medication:   Start taking Norvasc/amlodipine 2.5 mg daily  *If you need a refill on your cardiac medications before your next appointment, please call your pharmacy*   Follow-Up: At [facility], you and your health needs are our priority.  As part of our continuing mission to provide you with exceptional heart care, we have created designated Provider Care Teams.  These Care Teams include your primary Cardiologist (physician) and Advanced Practice Providers (APPs -  Physician Assistants and Nurse Practitioners) who all work together to provide you with the care you need, when you need it.  Your next appointment:   1 year(s)  The format for your next appointment:   In Person  Provider:   Werner Lean, MD      Important Information About Sugar

## 2022-02-18 ENCOUNTER — Other Ambulatory Visit: Payer: Self-pay | Admitting: Internal Medicine

## 2022-02-19 NOTE — Telephone Encounter (Signed)
Rx refill sent to pharmacy. 

## 2022-02-22 ENCOUNTER — Other Ambulatory Visit: Payer: Self-pay | Admitting: Internal Medicine

## 2022-02-22 DIAGNOSIS — Z0000 Encounter for general adult medical examination without abnormal findings: Secondary | ICD-10-CM | POA: Diagnosis not present

## 2022-02-22 DIAGNOSIS — Z1331 Encounter for screening for depression: Secondary | ICD-10-CM | POA: Diagnosis not present

## 2022-02-22 DIAGNOSIS — Z23 Encounter for immunization: Secondary | ICD-10-CM | POA: Diagnosis not present

## 2022-02-22 DIAGNOSIS — N3281 Overactive bladder: Secondary | ICD-10-CM | POA: Diagnosis not present

## 2022-02-22 DIAGNOSIS — Z86010 Personal history of colonic polyps: Secondary | ICD-10-CM | POA: Diagnosis not present

## 2022-02-22 DIAGNOSIS — I493 Ventricular premature depolarization: Secondary | ICD-10-CM | POA: Diagnosis not present

## 2022-02-22 DIAGNOSIS — M8589 Other specified disorders of bone density and structure, multiple sites: Secondary | ICD-10-CM | POA: Diagnosis not present

## 2022-02-22 DIAGNOSIS — E669 Obesity, unspecified: Secondary | ICD-10-CM | POA: Diagnosis not present

## 2022-02-22 DIAGNOSIS — K219 Gastro-esophageal reflux disease without esophagitis: Secondary | ICD-10-CM | POA: Diagnosis not present

## 2022-02-22 DIAGNOSIS — Z6832 Body mass index (BMI) 32.0-32.9, adult: Secondary | ICD-10-CM | POA: Diagnosis not present

## 2022-02-22 DIAGNOSIS — I10 Essential (primary) hypertension: Secondary | ICD-10-CM | POA: Diagnosis not present

## 2022-02-22 DIAGNOSIS — F410 Panic disorder [episodic paroxysmal anxiety] without agoraphobia: Secondary | ICD-10-CM | POA: Diagnosis not present

## 2022-02-22 DIAGNOSIS — E7800 Pure hypercholesterolemia, unspecified: Secondary | ICD-10-CM | POA: Diagnosis not present

## 2022-02-22 DIAGNOSIS — Z79899 Other long term (current) drug therapy: Secondary | ICD-10-CM | POA: Diagnosis not present

## 2022-02-23 ENCOUNTER — Other Ambulatory Visit: Payer: Self-pay | Admitting: Internal Medicine

## 2022-02-23 DIAGNOSIS — M8589 Other specified disorders of bone density and structure, multiple sites: Secondary | ICD-10-CM

## 2022-04-04 ENCOUNTER — Other Ambulatory Visit: Payer: Self-pay | Admitting: Internal Medicine

## 2022-04-04 DIAGNOSIS — Z1231 Encounter for screening mammogram for malignant neoplasm of breast: Secondary | ICD-10-CM

## 2022-05-08 ENCOUNTER — Other Ambulatory Visit: Payer: Medicare Other

## 2022-05-11 ENCOUNTER — Ambulatory Visit
Admission: RE | Admit: 2022-05-11 | Discharge: 2022-05-11 | Disposition: A | Discharge status: Home / Self Care | Payer: Medicare Other | Source: Ambulatory Visit | Attending: Internal Medicine | Admitting: Internal Medicine

## 2022-05-11 DIAGNOSIS — Z1231 Encounter for screening mammogram for malignant neoplasm of breast: Secondary | ICD-10-CM

## 2022-05-11 LAB — HM MAMMOGRAPHY

## 2022-08-28 DIAGNOSIS — D123 Benign neoplasm of transverse colon: Secondary | ICD-10-CM | POA: Diagnosis not present

## 2022-08-28 DIAGNOSIS — K5730 Diverticulosis of large intestine without perforation or abscess without bleeding: Secondary | ICD-10-CM | POA: Diagnosis not present

## 2022-08-28 DIAGNOSIS — Z86010 Personal history of colonic polyps: Secondary | ICD-10-CM | POA: Diagnosis not present

## 2022-08-28 DIAGNOSIS — D122 Benign neoplasm of ascending colon: Secondary | ICD-10-CM | POA: Diagnosis not present

## 2022-08-28 DIAGNOSIS — Z800 Family history of malignant neoplasm of digestive organs: Secondary | ICD-10-CM | POA: Diagnosis not present

## 2022-08-28 DIAGNOSIS — Z09 Encounter for follow-up examination after completed treatment for conditions other than malignant neoplasm: Secondary | ICD-10-CM | POA: Diagnosis not present

## 2022-08-30 DIAGNOSIS — D123 Benign neoplasm of transverse colon: Secondary | ICD-10-CM | POA: Diagnosis not present

## 2022-08-30 DIAGNOSIS — D122 Benign neoplasm of ascending colon: Secondary | ICD-10-CM | POA: Diagnosis not present

## 2022-10-08 ENCOUNTER — Ambulatory Visit: Admission: RE | Admit: 2022-10-08 | Payer: Medicare Other | Source: Ambulatory Visit

## 2022-10-08 DIAGNOSIS — M8589 Other specified disorders of bone density and structure, multiple sites: Secondary | ICD-10-CM

## 2022-10-08 DIAGNOSIS — N958 Other specified menopausal and perimenopausal disorders: Secondary | ICD-10-CM | POA: Diagnosis not present

## 2022-10-08 DIAGNOSIS — Z90722 Acquired absence of ovaries, bilateral: Secondary | ICD-10-CM | POA: Diagnosis not present

## 2022-10-08 DIAGNOSIS — M8588 Other specified disorders of bone density and structure, other site: Secondary | ICD-10-CM | POA: Diagnosis not present

## 2022-11-19 ENCOUNTER — Other Ambulatory Visit: Payer: Self-pay | Admitting: Internal Medicine

## 2022-11-22 DIAGNOSIS — H2513 Age-related nuclear cataract, bilateral: Secondary | ICD-10-CM | POA: Diagnosis not present

## 2022-11-22 DIAGNOSIS — H5213 Myopia, bilateral: Secondary | ICD-10-CM | POA: Diagnosis not present

## 2022-11-22 DIAGNOSIS — H40013 Open angle with borderline findings, low risk, bilateral: Secondary | ICD-10-CM | POA: Diagnosis not present

## 2022-12-10 DIAGNOSIS — H2512 Age-related nuclear cataract, left eye: Secondary | ICD-10-CM | POA: Diagnosis not present

## 2022-12-10 DIAGNOSIS — H25812 Combined forms of age-related cataract, left eye: Secondary | ICD-10-CM | POA: Diagnosis not present

## 2022-12-10 DIAGNOSIS — Z961 Presence of intraocular lens: Secondary | ICD-10-CM | POA: Diagnosis not present

## 2023-01-09 ENCOUNTER — Other Ambulatory Visit: Payer: Self-pay | Admitting: Nurse Practitioner

## 2023-01-14 DIAGNOSIS — H25811 Combined forms of age-related cataract, right eye: Secondary | ICD-10-CM | POA: Diagnosis not present

## 2023-01-14 DIAGNOSIS — H2511 Age-related nuclear cataract, right eye: Secondary | ICD-10-CM | POA: Diagnosis not present

## 2023-02-02 ENCOUNTER — Other Ambulatory Visit: Payer: Self-pay | Admitting: Internal Medicine

## 2023-02-05 ENCOUNTER — Other Ambulatory Visit: Payer: Self-pay | Admitting: Internal Medicine

## 2023-02-18 ENCOUNTER — Other Ambulatory Visit: Payer: Self-pay | Admitting: Internal Medicine

## 2023-03-18 ENCOUNTER — Other Ambulatory Visit: Payer: Self-pay | Admitting: Nurse Practitioner

## 2023-03-19 ENCOUNTER — Other Ambulatory Visit: Payer: Self-pay | Admitting: Nurse Practitioner

## 2023-04-11 NOTE — H&P (View-Only) (Signed)
 Cardiology Office Note    Patient Name: Jody Garrett Date of Encounter: 04/11/2023  Primary Care Provider:  Signa Rush, MD (Inactive) Primary Cardiologist:  Stanly DELENA Leavens, MD Primary Electrophysiologist: None   Past Medical History    Past Medical History:  Diagnosis Date   Arthritis    Asthma    as child   Fibromyalgia    GERD (gastroesophageal reflux disease)    Neuromuscular disorder ([facility])    headaches    History of Present Illness  Jody Garrett is a 73 y.o. female with a PMH of HLD, HTN, PVCs, fibromyalgia, GERD, mild MR who presents today for 1 year follow-up.  Jody Garrett was seen initially by Dr. Arnetha on 05/27/2020 by referral of PCP for evaluation of chest pain.  She was found to have elevated BP in the 160s to 170s and EKG was completed showing frequent PVCs.  2D echo was completed showing EF of 5 to 60% with mild LVH and mild MVR.  She had metoprolol  increased and CMR was completed that showed no LGE or evidence of ischemia.  She wore an event monitor that showed no significant change in PVC burden.  She was seen in follow-up on 11/2020 and metoprolol  was decreased due to fatigue.  She was last seen by Rosaline Bane NP on 11/23/2021 and reported PVCs are less symptomatic with metoprolol .  She had EKG completed that showed frequent PVCs with metoprolol  increased to 150 mg daily. She was last seen by Dr. Arnetha on 02/16/2022 and reported no new cardiac complaints.  She did note BP at home was in the high 130s to 140s with normal diastolic.  She was advised to repeat echo in 3 years for monitoring of MVP and mild MR.  She was advised to continue current medications as prescribed.  Jody Garrett presents for a follow-up visit. She reports that her PVC symptoms have been well-controlled with metoprolol  and another unspecified medication. However, she notes that she occasionally feels the PVCs more prominently when lying down. She also  reports increased swelling in her ankles, a symptom she has experienced for several years but which has recently worsened. The patient is currently on metoprolol , Norvasc , Lipitor, and Xanax . She has been on Xanax  for a long time, primarily for fibromyalgia symptoms, and has successfully reduced her dosage from three times a day to once a day. The patient also mentions that she has been experiencing some skin irritation from the tape used for her event monitor.  Patient denies chest pain, palpitations, dyspnea, PND, orthopnea, nausea, vomiting, dizziness, syncope, edema, weight gain, or early satiety.  Review of Systems  Please see the history of present illness.    All other systems reviewed and are otherwise negative except as noted above.  Physical Exam    Wt Readings from Last 3 Encounters:  02/16/22 193 lb (87.5 kg)  11/23/21 197 lb 12.8 oz (89.7 kg)  11/30/20 190 lb (86.2 kg)   CD:Uyzmz were no vitals filed for this visit.,There is no height or weight on file to calculate BMI. GEN: Well nourished, well developed in no acute distress Neck: No JVD; No carotid bruits Pulmonary: Clear to auscultation without rales, wheezing or rhonchi  Cardiovascular: Normal rate. Regular rhythm. Normal S1. Normal S2.   Murmurs: There is no murmur.  ABDOMEN: Soft, non-tender, non-distended EXTREMITIES:  No edema; No deformity   EKG/LABS/ Recent Cardiac Studies   ECG personally reviewed by me today -sinus rhythm with PVCs and trigeminy pattern with  rate of 73 bpm and incomplete RBBB with no acute changes consistent with previous EKG.  Risk Assessment/Calculations:          Lab Results  Component Value Date   WBC 5.3 08/22/2020   HGB 12.8 08/22/2020   HCT 37.1 08/22/2020   MCV 87 08/22/2020   PLT 203 08/22/2020   Lab Results  Component Value Date   CREATININE 0.80 08/22/2020   BUN 8 08/22/2020   NA 138 08/22/2020   K 4.7 08/22/2020   CL 103 08/22/2020   CO2 24 08/22/2020   No results  found for: CHOL, HDL, LDLCALC, LDLDIRECT, TRIG, CHOLHDL  No results found for: HGBA1C Assessment & Plan    1.  History of PVCs: -Trigeminy pattern noted on EKG, increased frequency compared to previous year. Patient reports decreased symptomatology since starting Metoprolol  150mg . No significant shortness of breath or fatigue reported. -Order event monitor to assess PVC burden and effectiveness of current therapy. -Consider increasing Metoprolol  to 100mg  BID if PVC burden is high. -Repeat echocardiogram to assess heart function and ensure no decrease in heart squeeze.  2.  Essential hypertension: -Patient's blood pressure today was controlled at 120/74 -Continue Norvasc  2.5 mg daily Toprol -XL grams p.m. and 50 mg a.m.  3.  Nonrheumatic MVR/MVP: -Patient reports no new symptoms of shortness of breath but does endorse increased lower extremity swelling -We will repeat 2D echo to evaluate heart structure and valve function.  4.  Hyperlipidemia: -Patient on Lipitor, cholesterol check scheduled for May 08, 2023. -Continue Lipitor as prescribed.     Disposition: Follow-up with Stanly DELENA Leavens, MD or APP in 12 months    Signed, Wyn Raddle, Jackee Shove, NP 04/11/2023, 2:58 PM  [facility]

## 2023-04-11 NOTE — Progress Notes (Signed)
 Cardiology Office Note    Patient Name: [name] Date of Encounter: 04/11/2023  Primary Care Provider:  Signa Rush, MD (Inactive) Primary Cardiologist:  Stanly DELENA Leavens, MD Primary Electrophysiologist: None   Past Medical History    Past Medical History:  Diagnosis Date   Arthritis    Asthma    as child   Fibromyalgia    GERD (gastroesophageal reflux disease)    Neuromuscular disorder (HCC)    headaches    History of Present Illness  Jody Garrett is a 73 y.o. female with a PMH of HLD, HTN, PVCs, fibromyalgia, GERD, mild MR who presents today for 1 year follow-up.  [name] was seen initially by Dr. Arnetha on 05/27/2020 by referral of PCP for evaluation of chest pain.  She was found to have elevated BP in the 160s to 170s and EKG was completed showing frequent PVCs.  2D echo was completed showing EF of 5 to 60% with mild LVH and mild MVR.  She had metoprolol  increased and CMR was completed that showed no LGE or evidence of ischemia.  She wore an event monitor that showed no significant change in PVC burden.  She was seen in follow-up on 11/2020 and metoprolol  was decreased due to fatigue.  She was last seen by Rosaline Bane NP on 11/23/2021 and reported PVCs are less symptomatic with metoprolol .  She had EKG completed that showed frequent PVCs with metoprolol  increased to 150 mg daily. She was last seen by Dr. Arnetha on 02/16/2022 and reported no new cardiac complaints.  She did note BP at home was in the high 130s to 140s with normal diastolic.  She was advised to repeat echo in 3 years for monitoring of MVP and mild MR.  She was advised to continue current medications as prescribed.  [name] presents for a follow-up visit. She reports that her PVC symptoms have been well-controlled with metoprolol  and another unspecified medication. However, she notes that she occasionally feels the PVCs more prominently when lying down. She also  reports increased swelling in her ankles, a symptom she has experienced for several years but which has recently worsened. The patient is currently on metoprolol , Norvasc , Lipitor, and Xanax . She has been on Xanax  for a long time, primarily for fibromyalgia symptoms, and has successfully reduced her dosage from three times a day to once a day. The patient also mentions that she has been experiencing some skin irritation from the tape used for her event monitor.  Patient denies chest pain, palpitations, dyspnea, PND, orthopnea, nausea, vomiting, dizziness, syncope, edema, weight gain, or early satiety.  Review of Systems  Please see the history of present illness.    All other systems reviewed and are otherwise negative except as noted above.  Physical Exam    Wt Readings from Last 3 Encounters:  02/16/22 193 lb (87.5 kg)  11/23/21 197 lb 12.8 oz (89.7 kg)  11/30/20 190 lb (86.2 kg)   CD:Uyzmz were no vitals filed for this visit.,There is no height or weight on file to calculate BMI. GEN: Well nourished, well developed in no acute distress Neck: No JVD; No carotid bruits Pulmonary: Clear to auscultation without rales, wheezing or rhonchi  Cardiovascular: Normal rate. Regular rhythm. Normal S1. Normal S2.   Murmurs: There is no murmur.  ABDOMEN: Soft, non-tender, non-distended EXTREMITIES:  No edema; No deformity   EKG/LABS/ Recent Cardiac Studies   ECG personally reviewed by me today -sinus rhythm with PVCs and trigeminy pattern with  rate of 73 bpm and incomplete RBBB with no acute changes consistent with previous EKG.  Risk Assessment/Calculations:          Lab Results  Component Value Date   WBC 5.3 08/22/2020   HGB 12.8 08/22/2020   HCT 37.1 08/22/2020   MCV 87 08/22/2020   PLT 203 08/22/2020   Lab Results  Component Value Date   CREATININE 0.80 08/22/2020   BUN 8 08/22/2020   NA 138 08/22/2020   K 4.7 08/22/2020   CL 103 08/22/2020   CO2 24 08/22/2020   No results  found for: "CHOL", "HDL", "LDLCALC", "LDLDIRECT", "TRIG", "CHOLHDL"  No results found for: "HGBA1C" Assessment & Plan    1.  History of PVCs: -Trigeminy pattern noted on EKG, increased frequency compared to previous year. Patient reports decreased symptomatology since starting Metoprolol 150mg . No significant shortness of breath or fatigue reported. -Order event monitor to assess PVC burden and effectiveness of current therapy. -Consider increasing Metoprolol to 100mg  BID if PVC burden is high. -Repeat echocardiogram to assess heart function and ensure no decrease in heart squeeze.  2.  Essential hypertension: -Patient's blood pressure today was controlled at 120/74 -Continue Norvasc 2.5 mg daily Toprol-XL grams p.m. and 50 mg a.m.  3.  Nonrheumatic MVR/MVP: -Patient reports no new symptoms of shortness of breath but does endorse increased lower extremity swelling -We will repeat 2D echo to evaluate heart structure and valve function.  4.  Hyperlipidemia: -Patient on Lipitor, cholesterol check scheduled for May 08, 2023. -Continue Lipitor as prescribed.     Disposition: Follow-up with Christell Constant, MD or APP in 12 months    Signed, Napoleon Form, Leodis Rains, NP 04/11/2023, 2:58 PM [facility]

## 2023-04-12 ENCOUNTER — Encounter: Payer: Self-pay | Admitting: Nurse Practitioner

## 2023-04-12 ENCOUNTER — Ambulatory Visit: Payer: Medicare Other | Attending: Nurse Practitioner | Admitting: Nurse Practitioner

## 2023-04-12 ENCOUNTER — Ambulatory Visit (INDEPENDENT_AMBULATORY_CARE_PROVIDER_SITE_OTHER): Payer: Medicare Other

## 2023-04-12 VITALS — BP 120/74 | HR 73 | Ht 65.0 in | Wt 191.0 lb

## 2023-04-12 DIAGNOSIS — I493 Ventricular premature depolarization: Secondary | ICD-10-CM

## 2023-04-12 DIAGNOSIS — I10 Essential (primary) hypertension: Secondary | ICD-10-CM

## 2023-04-12 DIAGNOSIS — I340 Nonrheumatic mitral (valve) insufficiency: Secondary | ICD-10-CM | POA: Diagnosis not present

## 2023-04-12 DIAGNOSIS — E785 Hyperlipidemia, unspecified: Secondary | ICD-10-CM | POA: Diagnosis not present

## 2023-04-12 NOTE — Patient Instructions (Addendum)
 Medication Instructions:  Your physician recommends that you continue on your current medications as directed. Please refer to the Current Medication list given to you today.  *If you need a refill on your cardiac medications before your next appointment, please call your pharmacy*  Testing/Procedures: Your physician has requested that you have an echocardiogram. Echocardiography is a painless test that uses sound waves to create images of your heart. It provides your doctor with information about the size and shape of your heart and how well your heart's chambers and valves are working. This procedure takes approximately one hour. There are no restrictions for this procedure. Please do NOT wear cologne, perfume, aftershave, or lotions (deodorant is allowed). Please arrive 15 minutes prior to your appointment time.  Please note: We ask at that you not bring children with you during ultrasound (echo/ vascular) testing. Due to room size and safety concerns, children are not allowed in the ultrasound rooms during exams. Our front office staff cannot provide observation of children in our lobby area while testing is being conducted. An adult accompanying a patient to their appointment will only be allowed in the ultrasound room at the discretion of the ultrasound technician under special circumstances. We apologize for any inconvenience.  Your physician has requested that you wear a Zio heart monitor for 7 days. This will be mailed to your home with instructions on how to apply the monitor and how to return it when finished. Please allow 2 weeks after returning the heart monitor before our office calls you with the results.   Follow-Up: At [facility], you and your health needs are our priority.  As part of our continuing mission to provide you with exceptional heart care, we have created designated Provider Care Teams.  These Care Teams include your primary Cardiologist (physician) and Advanced  Practice Providers (APPs -  Physician Assistants and Nurse Practitioners) who all work together to provide you with the care you need, when you need it.  Your next appointment:   1 year(s)  The format for your next appointment:   In Person  Provider:   Stanly DELENA Leavens, MD {  Other Instructions ZIO XT- Long Term Monitor Instructions     Your physician has requested you wear a ZIO patch monitor for 7 days.  This is a single patch monitor. [employer] supplies one patch monitor per enrollment. Additional  stickers are not available. Please do not apply patch if you will be having a Nuclear Stress Test,  Echocardiogram, Cardiac CT, MRI, or Chest Xray during the period you would be wearing the  monitor. The patch cannot be worn during these tests. You cannot remove and re-apply the  ZIO XT patch monitor.  Your ZIO patch monitor will be mailed 3 day USPS to your address on file. It may take 3-5 days  to receive your monitor after you have been enrolled.  Once you have received your monitor, please review the enclosed instructions. Your monitor  has already been registered assigning a specific monitor serial # to you.     Billing and Patient Assistance Program Information     We have supplied [employer] with any of your insurance information on file for billing purposes.  [employer] offers a sliding scale Patient Assistance Program for patients that do not have  insurance, or whose insurance does not completely cover the cost of the ZIO monitor.  You must apply for the Patient Assistance Program to qualify for this discounted rate.  To apply, please  call [employer] at [phone], select option 4, select option 2, ask to apply for  Patient Assistance Program. Meredeth will ask your household income, and how many people  are in your household. They will quote your out-of-pocket cost based on that information.  [employer] will also be able to set up a 54-month, interest-free payment plan if needed.      Applying the monitor     Shave hair from upper left chest.  Hold abrader disc by orange tab. Rub abrader in 40 strokes over the upper left chest as  indicated in your monitor instructions.  Clean area with 4 enclosed alcohol pads. Let dry.  Apply patch as indicated in monitor instructions. Patch will be placed under collarbone on left  side of chest with arrow pointing upward.  Rub patch adhesive wings for 2 minutes. Remove white label marked 1. Remove the white  label marked 2. Rub patch adhesive wings for 2 additional minutes.  While looking in a mirror, press and release button in center of patch. A small green light will  flash 3-4 times. This will be your only indicator that the monitor has been turned on.  Do not shower for the first 24 hours. You may shower after the first 24 hours.  Press the button if you feel a symptom. You will hear a small click. Record Date, Time and  Symptom in the Patient Logbook.  When you are ready to remove the patch, follow instructions on the last 2 pages of Patient  Logbook. Stick patch monitor onto the last page of Patient Logbook.  Place Patient Logbook in the blue and white box. Use locking tab on box and tape box closed  securely. The blue and white box has prepaid postage on it. Please place it in the mailbox as  soon as possible. Your physician should have your test results approximately 7 days after the  monitor has been mailed back to [facility].  Call [facility] Customer Care at [phone] if you have questions regarding  your ZIO XT patch monitor. Call them immediately if you see an orange light blinking on your  monitor.  If your monitor falls off in less than 4 days, contact our Monitor department at [phone].  If your monitor becomes loose or falls off after 4 days call [employer] at [phone] for  suggestions on securing your monitor.      1st Floor: - Lobby - Registration  - Pharmacy  - Lab -  Cafe  2nd Floor: - PV Lab - Diagnostic Testing (echo, CT, nuclear med)  3rd Floor: - Vacant  4th Floor: - TCTS (cardiothoracic surgery) - AFib Clinic - Structural Heart Clinic - Vascular Surgery  - Vascular Ultrasound  5th Floor: - HeartCare Cardiology (general and EP) - Clinical Pharmacy for coumadin, hypertension, lipid, weight-loss medications, and med management appointments    Valet parking services will be available as well.

## 2023-04-12 NOTE — Progress Notes (Unsigned)
Applied a 14 day Zio XT monitor to patient in the office Chandrasekhar to read

## 2023-04-26 DIAGNOSIS — I493 Ventricular premature depolarization: Secondary | ICD-10-CM | POA: Diagnosis not present

## 2023-04-29 ENCOUNTER — Telehealth: Payer: Self-pay

## 2023-04-29 MED ORDER — METOPROLOL SUCCINATE ER 100 MG PO TB24: 100.0000 mg | ORAL_TABLET | Freq: Two times a day (BID) | ORAL | 3 refills | Status: AC

## 2023-04-29 NOTE — Telephone Encounter (Signed)
 The patient has been notified of the result and verbalized understanding.  All questions (if any) were answered. Cindi Carbon [place], RN 04/29/2023 4:28 PM    Placed order for metoprolol increase. Patient has echo tomorrow.

## 2023-04-29 NOTE — Telephone Encounter (Signed)
-----   Message from Napoleon Form, Leodis Rains sent at 04/29/2023  7:06 AM EST ----- Please let patient know that her predominant rhythm was sinus with frequent isolated PVCs asymptomatic with a high burden.  Please advise patient to increase dose of metoprolol to 100 mg twice a day.  We will continue annual 2D echo to evaluate and monitor LV function to prevent possible cardiomyopathy.  We will be in touch with your 2D echo results following your examination.  Please let us know if you have any further questions.  Robin Searing, NP

## 2023-04-29 NOTE — Telephone Encounter (Signed)
 This encounter was created in error - please disregard.

## 2023-04-30 ENCOUNTER — Ambulatory Visit (HOSPITAL_COMMUNITY): Payer: Medicare Other | Attending: Cardiovascular Disease

## 2023-04-30 DIAGNOSIS — I340 Nonrheumatic mitral (valve) insufficiency: Secondary | ICD-10-CM | POA: Diagnosis not present

## 2023-04-30 LAB — ECHOCARDIOGRAM COMPLETE
Area-P 1/2: 2.09 cm2
MV M vel: 4.66 m/s
MV Peak grad: 86.9 mmHg
Radius: 0.5 cm
S' Lateral: 2.81 cm

## 2023-05-02 ENCOUNTER — Telehealth: Payer: Self-pay | Admitting: Internal Medicine

## 2023-05-02 DIAGNOSIS — I340 Nonrheumatic mitral (valve) insufficiency: Secondary | ICD-10-CM

## 2023-05-02 DIAGNOSIS — I10 Essential (primary) hypertension: Secondary | ICD-10-CM

## 2023-05-02 NOTE — Addendum Note (Signed)
 Addended by: Macie Burows on: 05/02/2023 11:03 AM   Modules accepted: Orders

## 2023-05-02 NOTE — Telephone Encounter (Signed)
 Called pt reviewed TEE instructions also placed on my chart as a letter for pt to view.  Pt reports will have labs drawn today at [facility].  All questions answered.

## 2023-05-02 NOTE — Telephone Encounter (Signed)
-----   Message from Napoleon Form, Leodis Rains sent at 04/30/2023  5:01 PM EST ----- Dr. Raynelle Jan,  Ms. Harnden  was seen recently for follow-up was sent for updated 2D echo that moderately elevated PASP with moderate to severe MVR and moderate TVR with severely dilated LA.  I will have her come in for follow-up and arrange for TEE for further evaluation.  I will also start her on Lasix 20 mg and have increased her metoprolol to 100 mg twice daily.  Please let me know if you have any further recommendations.  Thanks, Robin Searing, NP

## 2023-05-02 NOTE — Telephone Encounter (Signed)
 Patient notes that she is doing fair.   Since last visit notes no sx save for rare leg swelling . There are no interval hospital/ED visit.    No chest pain or pressure .  No SOB/DOE and no PND/Orthopnea.  No weight gain.  No palpitations or syncope.  TTE: Mod-Sev MR LA dilation Mod to Mod Sev TV Indeterminate diastology  After careful review of history and examination, the risks and benefits of transesophageal echocardiogram have been explained including risks of esophageal damage, perforation (1:10,000 risk), bleeding, pharyngeal hematoma as well as other potential complications associated with conscious sedation including aspiration, arrhythmia, respiratory failure and death. Alternatives to treatment were discussed, questions were answered. Patient is willing to proceed.   Plan for TEE 05/09/23.  I will write a pre-procedural H&P prior to procedure.  Will need labs.  Riley Lam, MD FASE [facility] Cardiologist [facility]  [address], [address] [place], [place] [address] [phone]  8:00 AM

## 2023-05-03 DIAGNOSIS — I10 Essential (primary) hypertension: Secondary | ICD-10-CM | POA: Diagnosis not present

## 2023-05-03 DIAGNOSIS — I340 Nonrheumatic mitral (valve) insufficiency: Secondary | ICD-10-CM | POA: Diagnosis not present

## 2023-05-04 LAB — BASIC METABOLIC PANEL
BUN/Creatinine Ratio: 13 (ref 12–28)
BUN: 11 mg/dL (ref 8–27)
CO2: 25 mmol/L (ref 20–29)
Calcium: 9.4 mg/dL (ref 8.7–10.3)
Chloride: 101 mmol/L (ref 96–106)
Creatinine, Ser: 0.87 mg/dL (ref 0.57–1.00)
Glucose: 88 mg/dL (ref 70–99)
Potassium: 5.1 mmol/L (ref 3.5–5.2)
Sodium: 140 mmol/L (ref 134–144)
eGFR: 72 mL/min/{1.73_m2} (ref >59)

## 2023-05-04 LAB — CBC
Hematocrit: 37.1 % (ref 34.0–46.6)
Hemoglobin: 12.5 g/dL (ref 11.1–15.9)
MCH: 30.6 pg (ref 26.6–33.0)
MCHC: 33.7 g/dL (ref 31.5–35.7)
MCV: 91 fL (ref 79–97)
Platelets: 245 10*3/uL (ref 150–450)
RBC: 4.08 x10E6/uL (ref 3.77–5.28)
RDW: 12.9 % (ref 11.7–15.4)
WBC: 7 10*3/uL (ref 3.4–10.8)

## 2023-05-08 ENCOUNTER — Ambulatory Visit: Payer: Medicare Other | Admitting: Family Medicine

## 2023-05-08 ENCOUNTER — Encounter: Payer: Self-pay | Admitting: Family Medicine

## 2023-05-08 VITALS — BP 138/76 | HR 89 | Temp 98.3°F | Ht 65.0 in | Wt 194.0 lb

## 2023-05-08 DIAGNOSIS — I340 Nonrheumatic mitral (valve) insufficiency: Secondary | ICD-10-CM | POA: Diagnosis not present

## 2023-05-08 DIAGNOSIS — N959 Unspecified menopausal and perimenopausal disorder: Secondary | ICD-10-CM | POA: Diagnosis not present

## 2023-05-08 DIAGNOSIS — E785 Hyperlipidemia, unspecified: Secondary | ICD-10-CM | POA: Diagnosis not present

## 2023-05-08 DIAGNOSIS — Z79899 Other long term (current) drug therapy: Secondary | ICD-10-CM

## 2023-05-08 DIAGNOSIS — I10 Essential (primary) hypertension: Secondary | ICD-10-CM

## 2023-05-08 NOTE — Assessment & Plan Note (Addendum)
 Non-fasting labs today. Recent CBC and CMP normal. Continue Atorvastatin 20mg  daily. Your labs showed elevated cholesterol. I recommend consuming a heart healthy diet such as Mediterranean diet or DASH diet with whole grains, fruits, vegetable, fish, lean meats, nuts, and olive oil. Limit sweets and processed foods. I also encourage moderate intensity exercise 150 minutes weekly. This is 3-5 times weekly for 30-50 minutes each session. Goal should be pace of 3 miles/hours, or walking 1.5 miles in 30 minutes.

## 2023-05-08 NOTE — Assessment & Plan Note (Signed)
 Discussed risks of long term use of Xanax in elderly. Pt is aware of risks of psychoactive medication use to include increased sedation, respiratory suppression, falls, extrapyramidal movements, dependence and cardiovascular events. Pt would like to continue treatment as benefit determined to outweigh risk. PDMP reviewed and appropriate and refill to be given for 3 months pending UDS. UDS done. Controlled substance agreement signed previously. Follow up in 3 months.

## 2023-05-08 NOTE — Progress Notes (Signed)
 New Patient Office Visit  Subjective    Patient ID: Jody Garrett, female    DOB: March 30, 1950  Age: 73 y.o. MRN: [id number]  CC:  Chief Complaint  Patient presents with   Establish Care    HPI Jody Garrett presents to establish care. Oriented to practice routines and expectations. Has been seeing PCP regularly. PMH includes mitral valve prolapse followed by cardiology with TEE planned for tomorrow and possible surgical repair, arthritis, fibromyalgia, GERD, HTN, HLD, anxiety. Jody Garrett does take Xanax 0.25mg  daily for many years for anxiety, last filled 01/02/2023, sleep, and muscle spasms. Her metoprolol was increased from 150mg  daily to 100mg  BID a few weeks ago. She has been on estrogen patch for 20 years.  She does see Cardiology, ophthalmology, dermatology, gastroenterology, dentist Jody Garrett eats a regular diet and does not exercise regularly   Breast CA screening: Mammogram status: Completed 05/11/2022. Repeat every year Colon CA screening: colonoscopy 1 years ago with abnormalities. Benign polyps, repeat 3y. DEXA: DEXA scan, most current DEXA scan T-score is minus 1.9. Tobacco: non-smoker, former, quit 27 years ago STI: declines Vaccines:  UTD  HYPERTENSION / HYPERLIPIDEMIA Satisfied with current treatment? yes Duration of hypertension: chronic BP monitoring frequency: not checking BP range:  BP medication side effects: no Past BP meds: amlodipine, Metoprolol Duration of hyperlipidemia: chronic Cholesterol medication side effects: no Cholesterol supplements: none Past cholesterol medications: atorvastain (lipitor) Medication compliance: excellent compliance Aspirin: no Recent stressors: no Recurrent headaches: no Visual changes: no Palpitations: yes Dyspnea: no Chest pain: no Lower extremity edema: no Dizzy/lightheaded: no     05/08/2023   11:10 AM  GAD 7 : Generalized Anxiety Score  Nervous, Anxious, on Edge 0  Control/stop worrying 0   Worry too much - different things 0  Restless 0  Easily annoyed or irritable 0  Afraid - awful might happen 0  Anxiety Difficulty Not difficult at all       05/08/2023   11:10 AM  Depression screen PHQ 2/9  Decreased Interest 0  Down, Depressed, Hopeless 0  PHQ - 2 Score 0  Altered sleeping 0  Tired, decreased energy 0  Change in appetite 0  Feeling bad or failure about yourself  0  Trouble concentrating 0  Moving slowly or fidgety/restless 0  Suicidal thoughts 0  PHQ-9 Score 0  Difficult doing work/chores Not difficult at all     Outpatient Encounter Medications as of 05/08/2023  Medication Sig   acetaminophen (TYLENOL) 500 MG tablet Take 500-1,000 mg by mouth every 6 (six) hours as needed (pain.).   ALPRAZolam (XANAX) 0.25 MG tablet Take 0.25 mg by mouth daily as needed (muscle pain/spasms.).   amLODipine (NORVASC) 2.5 MG tablet TAKE 1 TABLET BY MOUTH EVERY DAY (Patient taking differently: Take 2.5 mg by mouth daily with lunch.)   amoxicillin (AMOXIL) 500 MG capsule Take 500 mg by mouth in the morning, at noon, in the evening, and at bedtime.   atorvastatin (LIPITOR) 20 MG tablet Take 20 mg by mouth in the morning.   Calcium Citrate-Vitamin D (CALCIUM CITRATE+D3 PO) Take 1 tablet by mouth 2 (two) times daily.   Cholecalciferol (VITAMIN D3 PO) Take 2,000 Units by mouth in the morning.   esomeprazole (NEXIUM) 20 MG capsule Take 20 mg by mouth daily.    Glycerin-Hypromellose-PEG 400 (DRY EYE RELIEF DROPS) 0.2-0.2-1 % SOLN Place 1-2 drops into both eyes 3 (three) times daily as needed (dry/irritated eyes.).   ibuprofen (ADVIL) 200 MG tablet Take 400-600  mg by mouth every 8 (eight) hours as needed (pain.).   metoprolol succinate (TOPROL-XL) 100 MG 24 hr tablet Take 1 tablet (100 mg total) by mouth 2 (two) times daily. Take with or immediately following a meal.   neomycin-bacitracin-polymyxin (NEOSPORIN) ointment Apply 1 application  topically as needed for wound care.   venlafaxine  XR (EFFEXOR-XR) 75 MG 24 hr capsule Take 150 mg by mouth at bedtime.   VIVELLE-DOT 0.1 MG/24HR patch Place 1 patch onto the skin every Wednesday and Saturday.    No facility-administered encounter medications on file as of 05/08/2023.    Past Medical History:  Diagnosis Date   Arthritis    Fibromyalgia    GERD (gastroesophageal reflux disease)    Mitral valve prolapse    Neuromuscular disorder (HCC)    headaches    Past Surgical History:  Procedure Laterality Date   ABDOMINAL HYSTERECTOMY     AUGMENTATION MAMMAPLASTY Bilateral    has had implants removed   COLONOSCOPY WITH PROPOFOL N/A 07/12/2015   Procedure: COLONOSCOPY WITH PROPOFOL;  Surgeon: Charolett Bumpers, MD;  Location: [facility];  Service: Endoscopy;  Laterality: N/A;   TONSILLECTOMY     TUBAL LIGATION      Family History  Problem Relation Age of Onset   Hypercholesterolemia Mother    Cerebral aneurysm Mother    Colon cancer Father    Hypertension Sister     Social History   Socioeconomic History   Marital status: Married    Spouse name: Not on file   Number of children: Not on file   Years of education: Not on file   Highest education level: Some college, no degree  Occupational History   Not on file  Tobacco Use   Smoking status: Former    Current packs/day: 0.00    Types: Cigarettes    Quit date: 07/04/2000    Years since quitting: 22.8   Smokeless tobacco: Never  Vaping Use   Vaping status: Never Used  Substance and Sexual Activity   Alcohol use: No   Drug use: No   Sexual activity: Not on file  Other Topics Concern   Not on file  Social History Narrative   Not on file   Social Drivers of Health   Financial Resource Strain: Low Risk  (05/03/2023)   Overall Financial Resource Strain (CARDIA)    Difficulty of Paying Living Expenses: Not hard at all  Food Insecurity: No Food Insecurity (05/03/2023)   Hunger Vital Sign    Worried About Running Out of Food in the Last Year: Never true    Ran  Out of Food in the Last Year: Never true  Transportation Needs: No Transportation Needs (05/03/2023)   PRAPARE - Transportation    Lack of Transportation (Medical): No    Lack of Transportation (Non-Medical): No  Physical Activity: Insufficiently Active (05/03/2023)   Exercise Vital Sign    Days of Exercise per Week: 2 days    Minutes of Exercise per Session: 30 min  Stress: No Stress Concern Present (05/03/2023)   [employer] of Occupational Health - Occupational Stress Questionnaire    Feeling of Stress : Only a little  Social Connections: Moderately Integrated (05/03/2023)   Social Connection and Isolation Panel [NHANES]    Frequency of Communication with Friends and Family: Twice a week    Frequency of Social Gatherings with Friends and Family: Once a week    Attends Religious Services: 1 to 4 times per year    Active  Member of Clubs or Organizations: No    Attends Engineer, structural: Not on file    Marital Status: Married  Catering manager Violence: Not on file    Review of Systems  Constitutional: Negative.   HENT: Negative.    Eyes: Negative.   Respiratory: Negative.    Cardiovascular:  Positive for palpitations.  Gastrointestinal: Negative.   Genitourinary: Negative.   Musculoskeletal: Negative.   Skin: Negative.   Neurological: Negative.   Endo/Heme/Allergies: Negative.   Psychiatric/Behavioral:  The patient is nervous/anxious.   All other systems reviewed and are negative.       Objective    BP 138/76 (BP Location: Left Arm, Patient Position: Sitting, Cuff Size: Normal)   Pulse 89   Temp 98.3 F (36.8 C) (Oral)   Ht 5\' 5"  (1.651 m)   Wt 194 lb (88 kg)   SpO2 97%   BMI 32.28 kg/m   Physical Exam Vitals and nursing note reviewed.  Constitutional:      Appearance: Normal appearance. She is normal weight.  HENT:     Head: Normocephalic and atraumatic.     Right Ear: Tympanic membrane, ear canal and external ear normal.     Left Ear:  Tympanic membrane, ear canal and external ear normal.     Nose: Nose normal.     Mouth/Throat:     Mouth: Mucous membranes are moist.     Pharynx: Oropharynx is clear.  Eyes:     Extraocular Movements: Extraocular movements intact.     Conjunctiva/sclera: Conjunctivae normal.     Pupils: Pupils are equal, round, and reactive to light.  Cardiovascular:     Rate and Rhythm: Normal rate. Rhythm irregular.     Pulses: Normal pulses.     Heart sounds: Normal heart sounds.  Pulmonary:     Effort: Pulmonary effort is normal.     Breath sounds: Normal breath sounds.  Abdominal:     General: Bowel sounds are normal.     Palpations: Abdomen is soft.  Musculoskeletal:        General: Normal range of motion.     Cervical back: Normal range of motion and neck supple.  Skin:    General: Skin is warm and dry.     Capillary Refill: Capillary refill takes less than 2 seconds.  Neurological:     General: No focal deficit present.     Mental Status: She is alert and oriented to person, place, and time. Mental status is at baseline.  Psychiatric:        Mood and Affect: Mood normal.        Behavior: Behavior normal.        Thought Content: Thought content normal.        Judgment: Judgment normal.         Assessment & Plan:   Problem List Items Addressed This Visit     Hyperlipidemia - Primary   Non-fasting labs today. Recent CBC and CMP normal. Continue Atorvastatin 20mg  daily. Your labs showed elevated cholesterol. I recommend consuming a heart healthy diet such as Mediterranean diet or DASH diet with whole grains, fruits, vegetable, fish, lean meats, nuts, and olive oil. Limit sweets and processed foods. I also encourage moderate intensity exercise 150 minutes weekly. This is 3-5 times weekly for 30-50 minutes each session. Goal should be pace of 3 miles/hours, or walking 1.5 miles in 30 minutes.      Relevant Orders   Lipid panel   Nonrheumatic mitral  valve regurgitation   Upcoming  TEE      Essential hypertension   Followed by cardiology, well controlled, continue Amlodipine 2.5mg  daily and Metoprolol 100mg  BID.  Recommend heart healthy diet such as Mediterranean diet with whole grains, fruits, vegetable, fish, lean meats, nuts, and olive oil. Limit salt. Encouraged moderate walking, 3-5 times/week for 30-50 minutes each session. Aim for at least 150 minutes.week. Goal should be pace of 3 miles/hours, or walking 1.5 miles in 30 minutes. Avoid tobacco products. Avoid excess alcohol. Take medications as prescribed and bring medications and blood pressure log with cuff to each office visit. Seek medical care for chest pain, palpitations, shortness of breath with exertion, dizziness/lightheadedness, vision changes, recurrent headaches, or swelling of extremities.       Menopausal and postmenopausal disorder   Pt reports history of hot flashes, has been on Estrogen patch for over 20 years. Plan to wean.       Controlled substance agreement signed   Discussed risks of long term use of Xanax in elderly. Pt is aware of risks of psychoactive medication use to include increased sedation, respiratory suppression, falls, extrapyramidal movements, dependence and cardiovascular events. Pt would like to continue treatment as benefit determined to outweigh risk. PDMP reviewed and appropriate and refill to be given for 3 months pending UDS. UDS done. Controlled substance agreement signed previously. Follow up in 3 months.       Relevant Orders   DRUG MONITOR, PANEL 4, W/CONF, URINE   PRESCRIBED DRUGS,MEDMATCH(R)    Return in about 3 months (around 08/08/2023) for follow-up, anxiety/depression.   Park Meo, FNP

## 2023-05-08 NOTE — Assessment & Plan Note (Signed)
 Pt reports history of hot flashes, has been on Estrogen patch for over 20 years. Plan to wean.

## 2023-05-08 NOTE — Assessment & Plan Note (Signed)
 Upcoming TEE

## 2023-05-08 NOTE — Progress Notes (Signed)
 Spoke to pt and instructed them to come at 0930 and to be NPO after 0000.   Confirmed that pt will have a ride home and someone to stay with them for 24 hours after the procedure. Instructed patient to not wear any jewelry or lotion.

## 2023-05-08 NOTE — Assessment & Plan Note (Signed)
 Followed by cardiology, well controlled, continue Amlodipine 2.5mg  daily and Metoprolol 100mg  BID.  Recommend heart healthy diet such as Mediterranean diet with whole grains, fruits, vegetable, fish, lean meats, nuts, and olive oil. Limit salt. Encouraged moderate walking, 3-5 times/week for 30-50 minutes each session. Aim for at least 150 minutes.week. Goal should be pace of 3 miles/hours, or walking 1.5 miles in 30 minutes. Avoid tobacco products. Avoid excess alcohol. Take medications as prescribed and bring medications and blood pressure log with cuff to each office visit. Seek medical care for chest pain, palpitations, shortness of breath with exertion, dizziness/lightheadedness, vision changes, recurrent headaches, or swelling of extremities.

## 2023-05-09 ENCOUNTER — Encounter (HOSPITAL_COMMUNITY): Payer: Self-pay | Admitting: Internal Medicine

## 2023-05-09 ENCOUNTER — Ambulatory Visit (HOSPITAL_COMMUNITY)
Admission: RE | Admit: 2023-05-09 | Discharge: 2023-05-09 | Disposition: A | Discharge status: Home / Self Care | Payer: Medicare Other | Attending: Internal Medicine | Admitting: Internal Medicine

## 2023-05-09 ENCOUNTER — Ambulatory Visit (HOSPITAL_BASED_OUTPATIENT_CLINIC_OR_DEPARTMENT_OTHER): Payer: Self-pay

## 2023-05-09 ENCOUNTER — Encounter (HOSPITAL_COMMUNITY)
Admission: RE | Disposition: A | Discharge status: Home / Self Care | Payer: Self-pay | Source: Home / Self Care | Attending: Internal Medicine

## 2023-05-09 ENCOUNTER — Ambulatory Visit (HOSPITAL_COMMUNITY): Payer: Self-pay

## 2023-05-09 ENCOUNTER — Other Ambulatory Visit: Payer: Self-pay

## 2023-05-09 ENCOUNTER — Encounter: Payer: Self-pay | Admitting: Family Medicine

## 2023-05-09 ENCOUNTER — Other Ambulatory Visit: Payer: Self-pay | Admitting: Family Medicine

## 2023-05-09 ENCOUNTER — Telehealth: Payer: Self-pay

## 2023-05-09 ENCOUNTER — Ambulatory Visit (HOSPITAL_COMMUNITY)
Admission: RE | Admit: 2023-05-09 | Discharge: 2023-05-09 | Disposition: A | Discharge status: Home / Self Care | Source: Ambulatory Visit | Attending: Internal Medicine | Admitting: Internal Medicine

## 2023-05-09 DIAGNOSIS — I340 Nonrheumatic mitral (valve) insufficiency: Secondary | ICD-10-CM

## 2023-05-09 DIAGNOSIS — E785 Hyperlipidemia, unspecified: Secondary | ICD-10-CM | POA: Diagnosis not present

## 2023-05-09 DIAGNOSIS — Z87891 Personal history of nicotine dependence: Secondary | ICD-10-CM | POA: Diagnosis not present

## 2023-05-09 DIAGNOSIS — M797 Fibromyalgia: Secondary | ICD-10-CM | POA: Insufficient documentation

## 2023-05-09 DIAGNOSIS — I361 Nonrheumatic tricuspid (valve) insufficiency: Secondary | ICD-10-CM | POA: Diagnosis not present

## 2023-05-09 DIAGNOSIS — I071 Rheumatic tricuspid insufficiency: Secondary | ICD-10-CM | POA: Diagnosis not present

## 2023-05-09 DIAGNOSIS — I10 Essential (primary) hypertension: Secondary | ICD-10-CM | POA: Insufficient documentation

## 2023-05-09 DIAGNOSIS — I493 Ventricular premature depolarization: Secondary | ICD-10-CM | POA: Insufficient documentation

## 2023-05-09 DIAGNOSIS — K219 Gastro-esophageal reflux disease without esophagitis: Secondary | ICD-10-CM | POA: Insufficient documentation

## 2023-05-09 DIAGNOSIS — Z79899 Other long term (current) drug therapy: Secondary | ICD-10-CM | POA: Insufficient documentation

## 2023-05-09 LAB — LIPID PANEL
Cholesterol: 157 mg/dL (ref <200)
HDL: 54 mg/dL (ref >=50)
LDL Cholesterol (Calc): 72 mg/dL
Non-HDL Cholesterol (Calc): 103 mg/dL (ref <130)
Total CHOL/HDL Ratio: 2.9 (calc) (ref <5.0)
Triglycerides: 216 mg/dL — ABNORMAL HIGH (ref <150)

## 2023-05-09 LAB — ECHO TEE

## 2023-05-09 SURGERY — TRANSESOPHAGEAL ECHOCARDIOGRAM (TEE) (CATHLAB)
Anesthesia: General

## 2023-05-09 MED ORDER — PROPOFOL 10 MG/ML IV BOLUS
INTRAVENOUS | Status: DC | PRN
  Administered 2023-05-09: 20 mg via INTRAVENOUS
  Administered 2023-05-09: 60 mg via INTRAVENOUS
  Administered 2023-05-09: 20 mg via INTRAVENOUS
  Administered 2023-05-09: 30 mg via INTRAVENOUS

## 2023-05-09 MED ORDER — LIDOCAINE 2% (20 MG/ML) 5 ML SYRINGE
INTRAMUSCULAR | Status: DC | PRN
  Administered 2023-05-09: 80 mg via INTRAVENOUS

## 2023-05-09 MED ORDER — PROPOFOL 500 MG/50ML IV EMUL
INTRAVENOUS | Status: DC | PRN
  Administered 2023-05-09: 140 ug/kg/min via INTRAVENOUS

## 2023-05-09 MED ORDER — SODIUM CHLORIDE 0.9 % IV SOLN: INTRAVENOUS | Status: DC

## 2023-05-09 NOTE — Anesthesia Preprocedure Evaluation (Signed)
 Anesthesia Evaluation  Patient identified by MRN, date of birth, ID band Patient awake    Reviewed: Allergy & Precautions, H&P , NPO status , Patient's Chart, lab work & pertinent test results  Airway Mallampati: II  TM Distance: >3 FB Neck ROM: Full    Dental no notable dental hx. (+) Teeth Intact, Dental Advisory Given   Pulmonary neg pulmonary ROS, Patient abstained from smoking., former smoker   Pulmonary exam normal breath sounds clear to auscultation       Cardiovascular hypertension, Pt. on medications + Valvular Problems/Murmurs MR  Rhythm:Regular Rate:Normal     Neuro/Psych negative neurological ROS  negative psych ROS   GI/Hepatic Neg liver ROS,GERD  Medicated,,  Endo/Other  negative endocrine ROS    Renal/GU negative Renal ROS  negative genitourinary   Musculoskeletal  (+) Arthritis , Osteoarthritis,  Fibromyalgia -  Abdominal   Peds  Hematology negative hematology ROS (+)   Anesthesia Other Findings   Reproductive/Obstetrics negative OB ROS                             Anesthesia Physical Anesthesia Plan  ASA: 2  Anesthesia Plan: General   Post-op Pain Management: Minimal or no pain anticipated   Induction: Intravenous  PONV Risk Score and Plan: 3 and Propofol infusion and Treatment may vary due to age or medical condition  Airway Management Planned: Natural Airway and Simple Face Mask  Additional Equipment:   Intra-op Plan:   Post-operative Plan:   Informed Consent: I have reviewed the patients History and Physical, chart, labs and discussed the procedure including the risks, benefits and alternatives for the proposed anesthesia with the patient or authorized representative who has indicated his/her understanding and acceptance.     Dental advisory given  Plan Discussed with: CRNA  Anesthesia Plan Comments:        Anesthesia Quick Evaluation

## 2023-05-09 NOTE — Interval H&P Note (Signed)
 History and Physical Interval Note:  05/09/2023 10:41 AM  Jody Garrett  has presented today for surgery, with the diagnosis of MITRAL REGURGIGATION,TRICUSPID REGURGIATION,LEFT ATRIAL DILATION.  The various methods of treatment have been discussed with the patient and family. After consideration of risks, benefits and other options for treatment, the patient has consented to  Procedure(s): TRANSESOPHAGEAL ECHOCARDIOGRAM (N/A) as a surgical intervention.  The patient's history has been reviewed, patient examined, no change in status, stable for surgery.  I have reviewed the patient's chart and labs.  Questions were answered to the patient's satisfaction.     Kerrilynn Derenzo A Huntley Knoop

## 2023-05-09 NOTE — Transfer of Care (Signed)
 Immediate Anesthesia Transfer of Care Note  Patient: Jody Garrett  Procedure(s) Performed: TRANSESOPHAGEAL ECHOCARDIOGRAM  Patient Location: PACU and Cath Lab  Anesthesia Type:MAC  Level of Consciousness: awake and patient cooperative  Airway & Oxygen Therapy: Patient Spontanous Breathing and Patient connected to nasal cannula oxygen  Post-op Assessment: Report given to RN and Post -op Vital signs reviewed and stable  Post vital signs: Reviewed and stable  Last Vitals:  Vitals Value Taken Time  BP 128/74 05/09/23 1115  Temp    Pulse 84 05/09/23 1115  Resp 28 05/09/23 1115  SpO2 94 % 05/09/23 1115  Vitals shown include unfiled device data.  Last Pain:  Vitals:   05/09/23 0953  TempSrc:   PainSc: 0-No pain         Complications: No notable events documented.

## 2023-05-09 NOTE — Telephone Encounter (Unsigned)
 Copied from CRM [id number]. Topic: Clinical - Medication Refill >> May 09, 2023  1:32 PM Emylou G wrote: Most Recent Primary Care Visit:  Provider: Kurtis Bushman S  Department: [facility]  Visit Type: NEW PT - OFFICE VISIT  Date: 05/08/2023  Medication: ALPRAZolam (XANAX) 0.25 MG tablet  Has the patient contacted their pharmacy? No (Agent: If no, request that the patient contact the pharmacy for the refill. If patient does not wish to contact the pharmacy document the reason why and proceed with request.) (Agent: If yes, when and what did the pharmacy advise?)  Is this the correct pharmacy for this prescription? Yes If no, delete pharmacy and type the correct one.  This is the patient's preferred pharmacy:  [id number] [place], [place] - [address] [place] [address] Phone: [phone] Fax: [phone]   Has the prescription been filled recently? no  Is the patient out of the medication? Yes  Has the patient been seen for an appointment in the last year OR does the patient have an upcoming appointment? Yes  Can we respond through MyChart? Yes  Agent: Please be advised that Rx refills may take up to 3 business days. We ask that you follow-up with your pharmacy.

## 2023-05-09 NOTE — CV Procedure (Signed)
    TRANSESOPHAGEAL ECHOCARDIOGRAM   NAME:  Jody Garrett    MRN: [id number] DOB:  1950/05/17    ADMIT DATE: 05/09/2023  INDICATIONS: Mitral regurgitation  PROCEDURE:   Informed consent was obtained prior to the procedure. The risks, benefits and alternatives for the procedure were discussed and the patient comprehended these risks.  Risks include, but are not limited to, cough, sore throat, vomiting, nausea, somnolence, esophageal and stomach trauma or perforation, bleeding, low blood pressure, aspiration, pneumonia, infection, trauma to the teeth and death.    Procedural time out performed. The oropharynx was anesthetized with topical 1% benzocaine.    Anesthesia was administered by Dr. Sampson Goon and team.    The patient's heart rate, blood pressure, and oxygen saturation are monitored continuously during the procedure.  The transesophageal probe was inserted in the esophagus and stomach without difficulty and multiple views were obtained.   COMPLICATIONS:    There were no immediate complications.  KEY FINDINGS:  Patient has moderate-to-severe mitral regurgitation with no discrete findings of truly severe mitral regurgitation save for cumulative 3D VCA of both jets 0.6 cm2.  TVI 1.1, Normal pulmonary vein flow.  Normal LV size and function Full report to follow. Will plan for CMR in 6 months for further assessment then follow up with me.  Riley Lam, MD [place]  [facility]  11:20 AM

## 2023-05-09 NOTE — Anesthesia Postprocedure Evaluation (Signed)
 Anesthesia Post Note  Patient: Jody Garrett  Procedure(s) Performed: TRANSESOPHAGEAL ECHOCARDIOGRAM     Patient location during evaluation: Cath Lab Anesthesia Type: MAC Level of consciousness: awake and alert Pain management: pain level controlled Vital Signs Assessment: post-procedure vital signs reviewed and stable Respiratory status: spontaneous breathing, nonlabored ventilation and respiratory function stable Cardiovascular status: stable and blood pressure returned to baseline Postop Assessment: no apparent nausea or vomiting Anesthetic complications: no  No notable events documented.  Last Vitals:  Vitals:   05/09/23 1125 05/09/23 1130  BP: 129/70 (!) 136/50  Pulse: 69 69  Resp: 17 15  Temp:    SpO2: 99% 91%    Last Pain:  Vitals:   05/09/23 1130  TempSrc:   PainSc: 0-No pain                 Clemon Devaul,W. EDMOND

## 2023-05-09 NOTE — Telephone Encounter (Signed)
 Copied from CRM [id number]. Topic: Clinical - Medication Refill >> May 09, 2023  1:32 PM Emylou G wrote: Most Recent Primary Care Visit:  Provider: Kurtis Bushman S  Department: [facility]  Visit Type: NEW PT - OFFICE VISIT  Date: 05/08/2023  Medication: ALPRAZolam (XANAX) 0.25 MG tablet  Has the patient contacted their pharmacy? No (Agent: If no, request that the patient contact the pharmacy for the refill. If patient does not wish to contact the pharmacy document the reason why and proceed with request.) (Agent: If yes, when and what did the pharmacy advise?)  Is this the correct pharmacy for this prescription? Yes If no, delete pharmacy and type the correct one.  This is the patient's preferred pharmacy:  [id number] [place], [place] - [address] [place] [address] Phone: [phone] Fax: [phone]   Has the prescription been filled recently? no  Is the patient out of the medication? Yes  Has the patient been seen for an appointment in the last year OR does the patient have an upcoming appointment? Yes  Can we respond through MyChart? Yes  Agent: Please be advised that Rx refills may take up to 3 business days. We ask that you follow-up with your pharmacy.

## 2023-05-09 NOTE — Telephone Encounter (Signed)
 Requested medication (s) are due for refill today -unsure  Requested medication (s) are on the active medication list -yes  Future visit scheduled -yes  Last refill: unknown  Notes to clinic: non delegated Rx  Requested Prescriptions  Pending Prescriptions Disp Refills   ALPRAZolam (XANAX) 0.25 MG tablet 30 tablet     Sig: Take 1 tablet (0.25 mg total) by mouth daily as needed (muscle pain/spasms.).     Not Delegated - Psychiatry: Anxiolytics/Hypnotics 2 Failed - 05/09/2023  2:26 PM      Failed - This refill cannot be delegated      Failed - Urine Drug Screen completed in last 360 days      Failed - Valid encounter within last 6 months    Recent Outpatient Visits   None            Passed - Patient is not pregnant         Requested Prescriptions  Pending Prescriptions Disp Refills   ALPRAZolam (XANAX) 0.25 MG tablet 30 tablet     Sig: Take 1 tablet (0.25 mg total) by mouth daily as needed (muscle pain/spasms.).     Not Delegated - Psychiatry: Anxiolytics/Hypnotics 2 Failed - 05/09/2023  2:26 PM      Failed - This refill cannot be delegated      Failed - Urine Drug Screen completed in last 360 days      Failed - Valid encounter within last 6 months    Recent Outpatient Visits   None            Passed - Patient is not pregnant

## 2023-05-13 ENCOUNTER — Telehealth: Payer: Self-pay

## 2023-05-13 ENCOUNTER — Encounter: Payer: Self-pay | Admitting: Family Medicine

## 2023-05-13 ENCOUNTER — Other Ambulatory Visit: Payer: Self-pay | Admitting: Family Medicine

## 2023-05-13 LAB — DRUG MONITOR, PANEL 4, W/CONF, URINE
Alphahydroxyalprazolam: 53 ng/mL — ABNORMAL HIGH (ref <25)
Alphahydroxymidazolam: NEGATIVE ng/mL (ref <50)
Alphahydroxytriazolam: NEGATIVE ng/mL (ref <50)
Aminoclonazepam: NEGATIVE ng/mL (ref <25)
Amphetamines: NEGATIVE ng/mL (ref <500)
Barbiturates: NEGATIVE ng/mL (ref <300)
Benzodiazepines: POSITIVE ng/mL — AB (ref <100)
Cocaine Metabolite: NEGATIVE ng/mL (ref <150)
Creatinine: 123.5 mg/dL (ref >=20.0)
Hydroxyethylflurazepam: NEGATIVE ng/mL (ref <50)
Lorazepam: NEGATIVE ng/mL (ref <50)
Methadone Metabolite: NEGATIVE ng/mL (ref <100)
Nordiazepam: NEGATIVE ng/mL (ref <50)
Opiates: NEGATIVE ng/mL (ref <100)
Oxazepam: NEGATIVE ng/mL (ref <50)
Oxidant: NEGATIVE ug/mL (ref <200)
Oxycodone: NEGATIVE ng/mL (ref <100)
Phencyclidine: NEGATIVE ng/mL (ref <25)
Temazepam: NEGATIVE ng/mL (ref <50)
pH: 7.2 (ref 4.5–9.0)

## 2023-05-13 LAB — PRESCRIBED DRUGS,MEDMATCH(R)

## 2023-05-13 LAB — DM TEMPLATE

## 2023-05-13 MED ORDER — ALPRAZOLAM 0.25 MG PO TABS
0.2500 mg | ORAL_TABLET | Freq: Every day | ORAL | 0 refills | Status: DC | PRN
Start: 2023-05-13 — End: 2023-08-17

## 2023-05-13 NOTE — Telephone Encounter (Signed)
 Copied from CRM [id number]. Topic: General - Other >> May 10, 2023  5:07 PM Emylou G wrote: Reason for CRM: please contact patient.. is upset still waiting on xanax to be refilled.. [phone]

## 2023-05-15 ENCOUNTER — Telehealth: Payer: Self-pay

## 2023-05-15 DIAGNOSIS — E785 Hyperlipidemia, unspecified: Secondary | ICD-10-CM

## 2023-05-15 DIAGNOSIS — I10 Essential (primary) hypertension: Secondary | ICD-10-CM

## 2023-05-15 DIAGNOSIS — I340 Nonrheumatic mitral (valve) insufficiency: Secondary | ICD-10-CM

## 2023-05-15 DIAGNOSIS — I493 Ventricular premature depolarization: Secondary | ICD-10-CM

## 2023-05-15 NOTE — Telephone Encounter (Signed)
 Called pt reviewed MD recommendations.  Also reviewed instructions for Cmri placed in a letter on my chart for pt to review.  Answered all questions.  Lab orders placed and released to be drawn prior to Sept Cmri.

## 2023-05-15 NOTE — Telephone Encounter (Signed)
-----   Message from Nurse Corky Crafts sent at 05/10/2023  8:48 AM EST ----- Regarding: FW: MR  ----- Message ----- From: Christell Constant, MD Sent: 05/09/2023  11:25 AM EST To: [facility] Triage Subject: MR                                             Please order Cardiac MRI in 6 months then follow up with me or Alden Server after.  Thanks, MAC

## 2023-06-12 ENCOUNTER — Other Ambulatory Visit: Payer: Self-pay | Admitting: Family Medicine

## 2023-06-13 ENCOUNTER — Other Ambulatory Visit: Payer: Self-pay | Admitting: Family Medicine

## 2023-06-13 MED ORDER — VENLAFAXINE HCL ER 75 MG PO CP24: 150.0000 mg | ORAL_CAPSULE | Freq: Every day | ORAL | 1 refills | Status: DC

## 2023-07-30 ENCOUNTER — Other Ambulatory Visit: Payer: Self-pay

## 2023-07-30 MED ORDER — AMLODIPINE BESYLATE 2.5 MG PO TABS: 2.5000 mg | ORAL_TABLET | Freq: Every day | ORAL | 3 refills | Status: AC

## 2023-08-08 ENCOUNTER — Ambulatory Visit: Admitting: Family Medicine

## 2023-08-15 ENCOUNTER — Other Ambulatory Visit: Payer: Self-pay | Admitting: Family Medicine

## 2023-08-19 ENCOUNTER — Ambulatory Visit (INDEPENDENT_AMBULATORY_CARE_PROVIDER_SITE_OTHER): Admitting: Family Medicine

## 2023-08-19 ENCOUNTER — Encounter: Payer: Self-pay | Admitting: Family Medicine

## 2023-08-19 VITALS — BP 117/82 | HR 69 | Temp 97.7°F | Ht 65.0 in | Wt 193.8 lb

## 2023-08-19 DIAGNOSIS — F411 Generalized anxiety disorder: Secondary | ICD-10-CM

## 2023-08-19 DIAGNOSIS — Z6830 Body mass index (BMI) 30.0-30.9, adult: Secondary | ICD-10-CM | POA: Diagnosis not present

## 2023-08-19 DIAGNOSIS — E66811 Obesity, class 1: Secondary | ICD-10-CM

## 2023-08-19 MED ORDER — VENLAFAXINE HCL ER 75 MG PO CP24: 225.0000 mg | ORAL_CAPSULE | Freq: Every day | ORAL | 1 refills | Status: DC

## 2023-08-19 NOTE — Assessment & Plan Note (Addendum)
 Breakthrough anxiety on current regimen. Would like to increase Effexor  to previous dose 225mg  daily. Continue Xanax  0.25mg  sparingly. Pt is aware of risks of psychoactive medication use to include increased sedation, respiratory suppression, falls, extrapyramidal movements, dependence and cardiovascular events. Pt would like to continue treatment as benefit determined to outweigh risk. PDMP reviewed and appropriate and refill given for 3 months. UDS done. Controlled substance agreement signed previously. Follow up in 6 weeks or sooner if needed

## 2023-08-19 NOTE — Assessment & Plan Note (Signed)
 Counseled on importance of weight management for overall health. Encouraged low calorie, heart healthy diet and moderate intensity exercise 150 minutes weekly. This is 3-5 times weekly for 30-50 minutes each session. Goal should be pace of 3 miles/hours, or walking 1.5 miles in 30 minutes and include strength training. Discussed risks of obesity. Declined referral to [facility].

## 2023-08-19 NOTE — Progress Notes (Signed)
 Subjective:  HPI: Jody Garrett is a 73 y.o. female presenting on 08/19/2023 for Medical Management of Chronic Issues (3 mo f/u /Wt loss concerns )   HPI Patient is in today for medication management and anxiety follow-up. [name] has been working to wean estrogen and xanax . She is currently taking Vivelle estrogen patch 0.1mg /24hr 1-2 weekly and Xanax  0.25mg  PRN daily. Was previously taking Effexor  225mg  daily. Had recent TEE with cardiology, will eventually need mitral valve repair. Will have MRI repeated in September 2025. Dr Paulita Boss is seeking second opinion based on promising TEE. Other concerns include weight loss, has tried various diets in past. Currently not consistent with calorie restricted heart healthy diet or exercise routine.     08/19/2023   12:12 PM 05/08/2023   11:10 AM  GAD 7 : Generalized Anxiety Score  Nervous, Anxious, on Edge 2 0  Control/stop worrying 0 0  Worry too much - different things 0 0  Trouble relaxing 0   Restless 0 0  Easily annoyed or irritable 0 0  Afraid - awful might happen 2 0  Total GAD 7 Score 4   Anxiety Difficulty Somewhat difficult Not difficult at all      Review of Systems  All other systems reviewed and are negative.   Relevant past medical history reviewed and updated as indicated.   Past Medical History:  Diagnosis Date   Arthritis    Cataract    Had cataract surgery Oct 2024 and Nov 2024   Fibromyalgia    GERD (gastroesophageal reflux disease)    Hypertension 2022   Mitral valve prolapse    Neuromuscular disorder (HCC)    headaches     Past Surgical History:  Procedure Laterality Date   ABDOMINAL HYSTERECTOMY     AUGMENTATION MAMMAPLASTY Bilateral    has had implants removed   COLONOSCOPY WITH PROPOFOL  N/A 07/12/2015   Procedure: COLONOSCOPY WITH PROPOFOL ;  Surgeon: Garrett Kallman, MD;  Location: [facility];  Service: Endoscopy;  Laterality: N/A;   EYE SURGERY  2024   Cataracts    TONSILLECTOMY     TRANSESOPHAGEAL ECHOCARDIOGRAM (CATH LAB) N/A 05/09/2023   Procedure: TRANSESOPHAGEAL ECHOCARDIOGRAM;  Surgeon: Jann Melody, MD;  Location: [facility];  Service: Cardiovascular;  Laterality: N/A;   TUBAL LIGATION      Allergies and medications reviewed and updated.   Current Outpatient Medications:    acetaminophen (TYLENOL) 500 MG tablet, Take 500-1,000 mg by mouth every 6 (six) hours as needed (pain.)., Disp: , Rfl:    ALPRAZolam  (XANAX ) 0.25 MG tablet, TAKE 1 TABLET BY MOUTH DAILY AS NEEDED FOR ANXIETY, Disp: 30 tablet, Rfl: 2   amLODipine  (NORVASC ) 2.5 MG tablet, Take 1 tablet (2.5 mg total) by mouth daily., Disp: 90 tablet, Rfl: 3   atorvastatin (LIPITOR) 20 MG tablet, Take 20 mg by mouth in the morning., Disp: , Rfl:    Calcium Citrate-Vitamin D (CALCIUM CITRATE+D3 PO), Take 1 tablet by mouth 2 (two) times daily., Disp: , Rfl:    Cholecalciferol (VITAMIN D3 PO), Take 2,000 Units by mouth in the morning., Disp: , Rfl:    esomeprazole (NEXIUM) 20 MG capsule, Take 20 mg by mouth daily. , Disp: , Rfl:    Glycerin-Hypromellose-PEG 400 (DRY EYE RELIEF DROPS) 0.2-0.2-1 % SOLN, Place 1-2 drops into both eyes 3 (three) times daily as needed (dry/irritated eyes.)., Disp: , Rfl:    ibuprofen (ADVIL) 200 MG tablet, Take 400-600 mg by mouth every 8 (eight) hours  as needed (pain.)., Disp: , Rfl:    metoprolol  succinate (TOPROL -XL) 100 MG 24 hr tablet, Take 1 tablet (100 mg total) by mouth 2 (two) times daily. Take with or immediately following a meal., Disp: 180 tablet, Rfl: 3   neomycin-bacitracin-polymyxin (NEOSPORIN) ointment, Apply 1 application  topically as needed for wound care., Disp: , Rfl:    VIVELLE-DOT 0.1 MG/24HR patch, Place 1 patch onto the skin every Wednesday and Saturday. , Disp: , Rfl: 11   venlafaxine  XR (EFFEXOR -XR) 75 MG 24 hr capsule, Take 3 capsules (225 mg total) by mouth at bedtime., Disp: 180 capsule, Rfl: 1  No Known  Allergies  Objective:   BP 117/82   Pulse 69   Temp 97.7 F (36.5 C)   Ht 5' 5 (1.651 m)   Wt 193 lb 12.8 oz (87.9 kg)   SpO2 95%   BMI 32.25 kg/m      08/19/2023   12:07 PM 05/09/2023   11:30 AM 05/09/2023   11:25 AM  Vitals with BMI  Height 5' 5    Weight 193 lbs 13 oz    BMI 32.25    Systolic 117 136 161  Diastolic 82 50 70  Pulse 69 69 69     Physical Exam Vitals and nursing note reviewed.  Constitutional:      Appearance: Normal appearance. She is obese.  HENT:     Head: Normocephalic and atraumatic.   Skin:    General: Skin is warm and dry.   Neurological:     General: No focal deficit present.     Mental Status: She is alert and oriented to person, place, and time. Mental status is at baseline.   Psychiatric:        Mood and Affect: Mood normal.        Behavior: Behavior normal.        Thought Content: Thought content normal.        Judgment: Judgment normal.     Assessment & Plan:  Generalized anxiety disorder Assessment & Plan: Breakthrough anxiety on current regimen. Would like to increase Effexor  to previous dose 225mg  daily. Continue Xanax  0.25mg  sparingly. Pt is aware of risks of psychoactive medication use to include increased sedation, respiratory suppression, falls, extrapyramidal movements, dependence and cardiovascular events. Pt would like to continue treatment as benefit determined to outweigh risk. PDMP reviewed and appropriate and refill given for 3 months. UDS done. Controlled substance agreement signed previously. Follow up in 6 weeks or sooner if needed   Class 1 obesity due to excess calories with serious comorbidity and body mass index (BMI) of 30.0 to 30.9 in adult Assessment & Plan: Counseled on importance of weight management for overall health. Encouraged low calorie, heart healthy diet and moderate intensity exercise 150 minutes weekly. This is 3-5 times weekly for 30-50 minutes each session. Goal should be pace of 3 miles/hours,  or walking 1.5 miles in 30 minutes and include strength training. Discussed risks of obesity. Declined referral to [facility].    Other orders -     Venlafaxine  HCl ER; Take 3 capsules (225 mg total) by mouth at bedtime.  Dispense: 180 capsule; Refill: 1     Follow up plan: Return in about 6 weeks (around 09/30/2023) for anxiety/depression.  Jenelle Mis, FNP

## 2023-10-01 ENCOUNTER — Ambulatory Visit: Admitting: Family Medicine

## 2023-10-01 ENCOUNTER — Encounter: Payer: Self-pay | Admitting: Family Medicine

## 2023-10-01 VITALS — BP 126/86 | HR 76 | Temp 98.1°F | Ht 65.0 in | Wt 198.0 lb

## 2023-10-01 DIAGNOSIS — F411 Generalized anxiety disorder: Secondary | ICD-10-CM

## 2023-10-01 DIAGNOSIS — E6609 Other obesity due to excess calories: Secondary | ICD-10-CM

## 2023-10-01 DIAGNOSIS — Z6832 Body mass index (BMI) 32.0-32.9, adult: Secondary | ICD-10-CM | POA: Diagnosis not present

## 2023-10-01 DIAGNOSIS — E66811 Obesity, class 1: Secondary | ICD-10-CM | POA: Diagnosis not present

## 2023-10-01 DIAGNOSIS — E785 Hyperlipidemia, unspecified: Secondary | ICD-10-CM | POA: Diagnosis not present

## 2023-10-01 DIAGNOSIS — I340 Nonrheumatic mitral (valve) insufficiency: Secondary | ICD-10-CM | POA: Diagnosis not present

## 2023-10-01 DIAGNOSIS — I10 Essential (primary) hypertension: Secondary | ICD-10-CM | POA: Diagnosis not present

## 2023-10-01 DIAGNOSIS — I493 Ventricular premature depolarization: Secondary | ICD-10-CM | POA: Diagnosis not present

## 2023-10-01 LAB — CBC
Hematocrit: 38.4 % (ref 34.0–46.6)
Hemoglobin: 12.4 g/dL (ref 11.1–15.9)
MCH: 30.5 pg (ref 26.6–33.0)
MCHC: 32.3 g/dL (ref 31.5–35.7)
MCV: 94 fL (ref 79–97)
Platelets: 222 x10E3/uL (ref 150–450)
RBC: 4.07 x10E6/uL (ref 3.77–5.28)
RDW: 13.5 % (ref 11.7–15.4)
WBC: 7.1 x10E3/uL (ref 3.4–10.8)

## 2023-10-01 NOTE — Assessment & Plan Note (Signed)
 Continue Effexor  225mg  daily. Continue Xanax  0.25mg  sparingly. Pt is aware of risks of psychoactive medication use to include increased sedation, respiratory suppression, falls, extrapyramidal movements, dependence and cardiovascular events. Pt would like to continue treatment as benefit determined to outweigh risk. PDMP reviewed and appropriate and refill given for 3 months. UDS done. Controlled substance agreement signed previously. Follow up in 6 months

## 2023-10-01 NOTE — Assessment & Plan Note (Signed)
 Counseled on importance of weight management for overall health. Encouraged low calorie, heart healthy diet and moderate intensity exercise 150 minutes weekly. This is 3-5 times weekly for 30-50 minutes each session. Goal should be pace of 3 miles/hours, or walking 1.5 miles in 30 minutes and include strength training. Discussed risks of obesity. Medications are cost prohibitive at this time, will refer to [facility].

## 2023-10-01 NOTE — Progress Notes (Signed)
 Subjective:  HPI: Jody Garrett is a 73 y.o. female presenting on 10/01/2023 for Follow-up (Patient states medication change is going well. )   HPI Patient is in today for follow-up anxiety and depression. Is doing well on increase in Effexor  to 225mg  daily. Continues to need Xanax  0.25mg  daily to help control symptoms. Denies panic attacks, SI/HI.   Jody Garrett is also interested in better management of her obesity. Her BMI is 32.95 and she has been unsuccessful in her attempts to lost weight. Has tried a reduced calorie diet. Exercise is limited by back pain. Would like to try GLP. No history of pancreatitis, personal or family history of MTC or MEN2.      08/19/2023   12:12 PM 05/08/2023   11:10 AM  GAD 7 : Generalized Anxiety Score  Nervous, Anxious, on Edge 2 0  Control/stop worrying 0 0  Worry too much - different things 0 0  Trouble relaxing 0   Restless 0 0  Easily annoyed or irritable 0 0  Afraid - awful might happen 2 0  Total GAD 7 Score 4   Anxiety Difficulty Somewhat difficult Not difficult at all       08/19/2023   12:11 PM 05/08/2023   11:10 AM  Depression screen PHQ 2/9  Decreased Interest 0 0  Down, Depressed, Hopeless 0 0  PHQ - 2 Score 0 0  Altered sleeping 0 0  Tired, decreased energy 2 0  Change in appetite 0 0  Feeling bad or failure about yourself  0 0  Trouble concentrating 0 0  Moving slowly or fidgety/restless 0 0  Suicidal thoughts 0 0  PHQ-9 Score 2 0  Difficult doing work/chores Somewhat difficult Not difficult at all      Review of Systems  All other systems reviewed and are negative.   Relevant past medical history reviewed and updated as indicated.   Past Medical History:  Diagnosis Date   Arthritis    Cataract    Had cataract surgery Oct 2024 and Nov 2024   Fibromyalgia    GERD (gastroesophageal reflux disease)    Hypertension 2022   Mitral valve prolapse    Neuromuscular disorder (HCC)    headaches     Past  Surgical History:  Procedure Laterality Date   ABDOMINAL HYSTERECTOMY     AUGMENTATION MAMMAPLASTY Bilateral    has had implants removed   COLONOSCOPY WITH PROPOFOL  N/A 07/12/2015   Procedure: COLONOSCOPY WITH PROPOFOL ;  Surgeon: Gladis MARLA Louder, MD;  Location: [facility];  Service: Endoscopy;  Laterality: N/A;   EYE SURGERY  2024   Cataracts   TONSILLECTOMY     TRANSESOPHAGEAL ECHOCARDIOGRAM (CATH LAB) N/A 05/09/2023   Procedure: TRANSESOPHAGEAL ECHOCARDIOGRAM;  Surgeon: Santo Stanly LABOR, MD;  Location: [facility];  Service: Cardiovascular;  Laterality: N/A;   TUBAL LIGATION      Allergies and medications reviewed and updated.   Current Outpatient Medications:    acetaminophen (TYLENOL) 500 MG tablet, Take 500-1,000 mg by mouth every 6 (six) hours as needed (pain.)., Disp: , Rfl:    ALPRAZolam  (XANAX ) 0.25 MG tablet, TAKE 1 TABLET BY MOUTH DAILY AS NEEDED FOR ANXIETY, Disp: 30 tablet, Rfl: 2   amLODipine  (NORVASC ) 2.5 MG tablet, Take 1 tablet (2.5 mg total) by mouth daily., Disp: 90 tablet, Rfl: 3   atorvastatin (LIPITOR) 20 MG tablet, Take 20 mg by mouth in the morning., Disp: , Rfl:    Calcium Citrate-Vitamin D (CALCIUM CITRATE+D3 PO),  Take 1 tablet by mouth 2 (two) times daily., Disp: , Rfl:    Cholecalciferol (VITAMIN D3 PO), Take 2,000 Units by mouth in the morning., Disp: , Rfl:    esomeprazole (NEXIUM) 20 MG capsule, Take 20 mg by mouth daily. , Disp: , Rfl:    Glycerin-Hypromellose-PEG 400 (DRY EYE RELIEF DROPS) 0.2-0.2-1 % SOLN, Place 1-2 drops into both eyes 3 (three) times daily as needed (dry/irritated eyes.)., Disp: , Rfl:    ibuprofen (ADVIL) 200 MG tablet, Take 400-600 mg by mouth every 8 (eight) hours as needed (pain.)., Disp: , Rfl:    metoprolol  succinate (TOPROL -XL) 100 MG 24 hr tablet, Take 1 tablet (100 mg total) by mouth 2 (two) times daily. Take with or immediately following a meal., Disp: 180 tablet, Rfl: 3   neomycin-bacitracin-polymyxin  (NEOSPORIN) ointment, Apply 1 application  topically as needed for wound care., Disp: , Rfl:    venlafaxine  XR (EFFEXOR -XR) 75 MG 24 hr capsule, Take 3 capsules (225 mg total) by mouth at bedtime., Disp: 180 capsule, Rfl: 1   VIVELLE-DOT 0.1 MG/24HR patch, Place 1 patch onto the skin every Wednesday and Saturday. , Disp: , Rfl: 11  No Known Allergies  Objective:   BP 126/86   Pulse 76   Temp 98.1 F (36.7 C) (Oral)   Ht 5' 5 (1.651 m)   Wt 198 lb (89.8 kg)   SpO2 96%   BMI 32.95 kg/m      10/01/2023   11:37 AM 08/19/2023   12:07 PM 05/09/2023   11:30 AM  Vitals with BMI  Height 5' 5 5' 5   Weight 198 lbs 193 lbs 13 oz   BMI 32.95 32.25   Systolic 126 117 863  Diastolic 86 82 50  Pulse 76 69 69     Physical Exam Vitals and nursing note reviewed.  Constitutional:      Appearance: Normal appearance. She is obese.  HENT:     Head: Normocephalic and atraumatic.  Skin:    General: Skin is warm and dry.  Neurological:     General: No focal deficit present.     Mental Status: She is alert and oriented to person, place, and time. Mental status is at baseline.  Psychiatric:        Mood and Affect: Mood normal.        Behavior: Behavior normal.        Thought Content: Thought content normal.        Judgment: Judgment normal.     Assessment & Plan:  Generalized anxiety disorder Assessment & Plan: Continue Effexor  225mg  daily. Continue Xanax  0.25mg  sparingly. Pt is aware of risks of psychoactive medication use to include increased sedation, respiratory suppression, falls, extrapyramidal movements, dependence and cardiovascular events. Pt would like to continue treatment as benefit determined to outweigh risk. PDMP reviewed and appropriate and refill given for 3 months. UDS done. Controlled substance agreement signed previously. Follow up in 6 months   Class 1 obesity due to excess calories with serious comorbidity and body mass index (BMI) of 32.0 to 32.9 in  adult Assessment & Plan: Counseled on importance of weight management for overall health. Encouraged low calorie, heart healthy diet and moderate intensity exercise 150 minutes weekly. This is 3-5 times weekly for 30-50 minutes each session. Goal should be pace of 3 miles/hours, or walking 1.5 miles in 30 minutes and include strength training. Discussed risks of obesity. Medications are cost prohibitive at this time, will refer to [facility].  Orders: -     Amb Ref to Medical Weight Management     Follow up plan: Return in about 6 months (around 04/02/2024) for chronic follow-up with labs 1 week prior.  Jeoffrey GORMAN Barrio, FNP

## 2023-10-02 ENCOUNTER — Ambulatory Visit: Payer: Self-pay | Admitting: Internal Medicine

## 2023-10-02 DIAGNOSIS — I10 Essential (primary) hypertension: Secondary | ICD-10-CM

## 2023-10-02 DIAGNOSIS — I340 Nonrheumatic mitral (valve) insufficiency: Secondary | ICD-10-CM

## 2023-10-07 ENCOUNTER — Encounter (INDEPENDENT_AMBULATORY_CARE_PROVIDER_SITE_OTHER): Payer: Self-pay

## 2023-11-11 ENCOUNTER — Encounter (HOSPITAL_COMMUNITY): Payer: Self-pay

## 2023-11-13 ENCOUNTER — Ambulatory Visit (HOSPITAL_COMMUNITY)
Admission: RE | Admit: 2023-11-13 | Discharge: 2023-11-13 | Disposition: A | Discharge status: Home / Self Care | Source: Ambulatory Visit | Attending: Internal Medicine | Admitting: Internal Medicine

## 2023-11-13 ENCOUNTER — Other Ambulatory Visit: Payer: Self-pay | Admitting: Internal Medicine

## 2023-11-13 DIAGNOSIS — I493 Ventricular premature depolarization: Secondary | ICD-10-CM | POA: Diagnosis not present

## 2023-11-13 DIAGNOSIS — I340 Nonrheumatic mitral (valve) insufficiency: Secondary | ICD-10-CM

## 2023-11-13 DIAGNOSIS — E785 Hyperlipidemia, unspecified: Secondary | ICD-10-CM | POA: Insufficient documentation

## 2023-11-13 DIAGNOSIS — I10 Essential (primary) hypertension: Secondary | ICD-10-CM

## 2023-11-13 MED ORDER — GADOBUTROL 1 MMOL/ML IV SOLN
10.0000 mL | Freq: Once | INTRAVENOUS | Status: AC | PRN
  Administered 2023-11-13: 10 mL via INTRAVENOUS

## 2023-11-14 ENCOUNTER — Other Ambulatory Visit: Payer: Self-pay | Admitting: Family Medicine

## 2023-11-18 NOTE — Addendum Note (Signed)
 Addended by: RANDY HAMP SAILOR on: 11/18/2023 01:04 PM   Modules accepted: Orders

## 2024-02-04 ENCOUNTER — Encounter: Payer: Self-pay | Admitting: Family Medicine

## 2024-02-04 ENCOUNTER — Ambulatory Visit: Admitting: Family Medicine

## 2024-02-04 VITALS — BP 140/88 | HR 70 | Temp 97.9°F | Ht 65.0 in | Wt 195.8 lb

## 2024-02-04 DIAGNOSIS — N3001 Acute cystitis with hematuria: Secondary | ICD-10-CM | POA: Diagnosis not present

## 2024-02-04 DIAGNOSIS — R300 Dysuria: Secondary | ICD-10-CM

## 2024-02-04 LAB — URINALYSIS, ROUTINE W REFLEX MICROSCOPIC
Bilirubin Urine: NEGATIVE
Glucose, UA: NEGATIVE
Hyaline Cast: NONE SEEN /LPF
Ketones, ur: NEGATIVE
Nitrite: NEGATIVE
Specific Gravity, Urine: 1.015 (ref 1.001–1.035)
pH: 6 (ref 5.0–8.0)

## 2024-02-04 LAB — MICROSCOPIC MESSAGE

## 2024-02-04 MED ORDER — SULFAMETHOXAZOLE-TRIMETHOPRIM 800-160 MG PO TABS: 1.0000 | ORAL_TABLET | Freq: Two times a day (BID) | ORAL | 0 refills | Status: DC

## 2024-02-04 NOTE — Progress Notes (Signed)
 Acute Office Visit  Patient ID: Skylee Baird, female    DOB: November 18, 1950, 73 y.o.   MRN: [id number]  PCP: Kayla Jeoffrey RAMAN, FNP  Chief Complaint  Patient presents with   Acute Visit    Possible UTI, urinary frequency, different color and smell to urine, pain and pressure after releasing.  Starting last Wednesday      Subjective:     HPI  Discussed the use of AI scribe software for clinical note transcription with the patient, who gave verbal consent to proceed.  History of Present Illness Aman Karlea Mckibbin is a 73 year old female who presents with symptoms suggestive of a urinary tract infection.  She has been experiencing pressure in her lower abdomen since last Wednesday, accompanied by a frequent urge to urinate and a burning sensation at the end of urination, described as 'tingle, tingle' and painful.  The symptoms worsened over the holiday weekend, with some improvement by Sunday, but she experienced a setback after being out all day yesterday.  She has noticed hematuria, primarily on tissue paper, but not in significant amounts.  No fever, chills, or body aches. She reports low back pain, which is not new for her.  She has not taken any medications like azo for symptom relief and has not had any recent antibiotics since a root canal in September.   Review of Systems  All other systems reviewed and are negative.      Objective:    BP (!) 140/88   Pulse 70   Temp 97.9 F (36.6 C)   Ht 5' 5 (1.651 m)   Wt 195 lb 12.8 oz (88.8 kg)   SpO2 97%   BMI 32.58 kg/m    Physical Exam Vitals and nursing note reviewed.  Constitutional:      Appearance: Normal appearance. She is normal weight.  HENT:     Head: Normocephalic and atraumatic.  Abdominal:     General: Bowel sounds are normal. There is no distension.     Palpations: Abdomen is soft.     Tenderness: There is no right CVA tenderness or left CVA tenderness.  Skin:    General: Skin is warm  and dry.  Neurological:     General: No focal deficit present.     Mental Status: She is alert and oriented to person, place, and time. Mental status is at baseline.  Psychiatric:        Mood and Affect: Mood normal.        Behavior: Behavior normal.        Thought Content: Thought content normal.        Judgment: Judgment normal.       No results found for any visits on 02/04/24.     Assessment & Plan:   Problem List Items Addressed This Visit   None Visit Diagnoses       Dysuria    -  Primary   Relevant Orders   Urinalysis, Routine w reflex microscopic   Urine Culture     Acute cystitis with hematuria           Assessment and Plan Assessment & Plan Urinary tract infection Urinalysis confirmed UTI with leukocytes and hematuria.  - Prescribed Bactrim - Push fluids - Follow up if symptoms persist or worsen    Meds ordered this encounter  Medications   sulfamethoxazole-trimethoprim (BACTRIM DS) 800-160 MG tablet    Sig: Take 1 tablet by mouth 2 (two) times daily.  Dispense:  14 tablet    Refill:  0    Supervising Provider:   DUANNE LOWERS T [[id number]]    Return if symptoms worsen or fail to improve.  Jeoffrey GORMAN Barrio, FNP  [facility]

## 2024-02-06 ENCOUNTER — Ambulatory Visit: Payer: Self-pay | Admitting: Family Medicine

## 2024-02-06 LAB — URINE CULTURE
MICRO NUMBER:: 17301564
SPECIMEN QUALITY:: ADEQUATE

## 2024-02-06 MED ORDER — NITROFURANTOIN MONOHYD MACRO 100 MG PO CAPS: 100.0000 mg | ORAL_CAPSULE | Freq: Two times a day (BID) | ORAL | 0 refills | Status: DC

## 2024-02-18 DIAGNOSIS — Z961 Presence of intraocular lens: Secondary | ICD-10-CM | POA: Diagnosis not present

## 2024-02-18 DIAGNOSIS — H5213 Myopia, bilateral: Secondary | ICD-10-CM | POA: Diagnosis not present

## 2024-02-18 DIAGNOSIS — H40013 Open angle with borderline findings, low risk, bilateral: Secondary | ICD-10-CM | POA: Diagnosis not present

## 2024-02-24 ENCOUNTER — Other Ambulatory Visit: Payer: Self-pay | Admitting: Family Medicine

## 2024-03-23 ENCOUNTER — Other Ambulatory Visit

## 2024-03-23 DIAGNOSIS — E785 Hyperlipidemia, unspecified: Secondary | ICD-10-CM

## 2024-03-23 DIAGNOSIS — I10 Essential (primary) hypertension: Secondary | ICD-10-CM

## 2024-03-24 LAB — CBC WITH DIFFERENTIAL/PLATELET
Absolute Lymphocytes: 1673 {cells}/uL (ref 850–3900)
Absolute Monocytes: 511 {cells}/uL (ref 200–950)
Basophils Absolute: 49 {cells}/uL (ref 0–200)
Basophils Relative: 0.7 %
Eosinophils Absolute: 147 {cells}/uL (ref 15–500)
Eosinophils Relative: 2.1 %
HCT: 36.8 % (ref 35.9–46.0)
Hemoglobin: 12.7 g/dL (ref 11.7–15.5)
MCH: 30.9 pg (ref 27.0–33.0)
MCHC: 34.5 g/dL (ref 31.6–35.4)
MCV: 89.5 fL (ref 81.4–101.7)
MPV: 11.1 fL (ref 7.5–12.5)
Monocytes Relative: 7.3 %
Neutro Abs: 4620 {cells}/uL (ref 1500–7800)
Neutrophils Relative %: 66 %
Platelets: 229 Thousand/uL (ref 140–400)
RBC: 4.11 Million/uL (ref 3.80–5.10)
RDW: 13.2 % (ref 11.0–15.0)
Total Lymphocyte: 23.9 %
WBC: 7 Thousand/uL (ref 3.8–10.8)

## 2024-03-24 LAB — COMPREHENSIVE METABOLIC PANEL WITH GFR
AG Ratio: 1.3 (calc) (ref 1.0–2.5)
ALT: 15 U/L (ref 6–29)
AST: 15 U/L (ref 10–35)
Albumin: 3.9 g/dL (ref 3.6–5.1)
Alkaline phosphatase (APISO): 88 U/L (ref 37–153)
BUN: 12 mg/dL (ref 7–25)
CO2: 30 mmol/L (ref 20–32)
Calcium: 9 mg/dL (ref 8.6–10.4)
Chloride: 102 mmol/L (ref 98–110)
Creat: 0.8 mg/dL (ref 0.60–1.00)
Globulin: 3.1 g/dL (ref 1.9–3.7)
Glucose, Bld: 86 mg/dL (ref 65–99)
Potassium: 4.9 mmol/L (ref 3.5–5.3)
Sodium: 138 mmol/L (ref 135–146)
Total Bilirubin: 0.3 mg/dL (ref 0.2–1.2)
Total Protein: 7 g/dL (ref 6.1–8.1)
eGFR: 79 mL/min/1.73m2

## 2024-03-24 LAB — LIPID PANEL
Cholesterol: 205 mg/dL — ABNORMAL HIGH
HDL: 49 mg/dL — ABNORMAL LOW
LDL Cholesterol (Calc): 117 mg/dL — ABNORMAL HIGH
Non-HDL Cholesterol (Calc): 156 mg/dL — ABNORMAL HIGH
Total CHOL/HDL Ratio: 4.2 (calc)
Triglycerides: 275 mg/dL — ABNORMAL HIGH

## 2024-03-25 ENCOUNTER — Ambulatory Visit: Payer: Self-pay | Admitting: Family Medicine

## 2024-03-30 ENCOUNTER — Other Ambulatory Visit: Payer: Self-pay | Admitting: Family Medicine

## 2024-03-30 ENCOUNTER — Ambulatory Visit: Admitting: Family Medicine

## 2024-04-09 ENCOUNTER — Ambulatory Visit: Admitting: Family Medicine

## 2024-04-09 ENCOUNTER — Encounter: Payer: Self-pay | Admitting: Family Medicine

## 2024-04-09 ENCOUNTER — Ambulatory Visit

## 2024-04-09 ENCOUNTER — Other Ambulatory Visit: Payer: Self-pay | Admitting: Family Medicine

## 2024-04-09 VITALS — BP 118/64 | Ht 65.0 in | Wt 197.0 lb

## 2024-04-09 VITALS — BP 118/64 | Ht 65.0 in | Wt 197.6 lb

## 2024-04-09 DIAGNOSIS — Z0000 Encounter for general adult medical examination without abnormal findings: Secondary | ICD-10-CM

## 2024-04-09 DIAGNOSIS — Z1231 Encounter for screening mammogram for malignant neoplasm of breast: Secondary | ICD-10-CM

## 2024-04-09 DIAGNOSIS — I340 Nonrheumatic mitral (valve) insufficiency: Secondary | ICD-10-CM

## 2024-04-09 DIAGNOSIS — M1990 Unspecified osteoarthritis, unspecified site: Secondary | ICD-10-CM | POA: Insufficient documentation

## 2024-04-09 DIAGNOSIS — Z780 Asymptomatic menopausal state: Secondary | ICD-10-CM | POA: Insufficient documentation

## 2024-04-09 DIAGNOSIS — E785 Hyperlipidemia, unspecified: Secondary | ICD-10-CM

## 2024-04-09 DIAGNOSIS — M797 Fibromyalgia: Secondary | ICD-10-CM | POA: Insufficient documentation

## 2024-04-09 DIAGNOSIS — I10 Essential (primary) hypertension: Secondary | ICD-10-CM

## 2024-04-09 DIAGNOSIS — F411 Generalized anxiety disorder: Secondary | ICD-10-CM

## 2024-04-09 DIAGNOSIS — E6609 Other obesity due to excess calories: Secondary | ICD-10-CM

## 2024-04-09 DIAGNOSIS — R232 Flushing: Secondary | ICD-10-CM | POA: Insufficient documentation

## 2024-04-09 MED ORDER — ROSUVASTATIN CALCIUM 20 MG PO TABS: 20.0000 mg | ORAL_TABLET | Freq: Every day | ORAL | 3 refills | Status: AC

## 2024-04-09 MED ORDER — VIVELLE-DOT 0.1 MG/24HR TD PTTW: 1.0000 | MEDICATED_PATCH | TRANSDERMAL | 2 refills | Status: DC

## 2024-04-09 NOTE — Progress Notes (Cosign Needed)
 "  Chief Complaint  Patient presents with   Medicare Wellness     Subjective:   Jody Garrett is a 74 y.o. female who presents for a Medicare Annual Wellness Visit.  Visit info / Clinical Intake: Medicare Wellness Visit Type:: Subsequent Annual Wellness Visit Persons participating in visit and providing information:: patient Medicare Wellness Visit Mode:: In-person (required for WTM) Interpreter Needed?: No Pre-visit prep was completed: yes AWV questionnaire completed by patient prior to visit?: yes (SDOH) Date:: 04/08/24 Living arrangements:: lives with spouse/significant other Patient's Overall Health Status Rating: very good Typical amount of pain: some Does pain affect daily life?: no Are you currently prescribed opioids?: no  Dietary Habits and Nutritional Risks How many meals a day?: 3 Eats fruit and vegetables daily?: yes Most meals are obtained by: preparing own meals In the last 2 weeks, have you had any of the following?: none Diabetic:: no  Functional Status Activities of Daily Living (to include ambulation/medication): Independent Ambulation: Independent Medication Administration: Independent Home Management (perform basic housework or laundry): Independent Manage your own finances?: yes Primary transportation is: driving Concerns about vision?: no *vision screening is required for WTM* Concerns about hearing?: no  Fall Screening Falls in the past year?: 0 Number of falls in past year: 0 Was there an injury with Fall?: 0 Fall Risk Category Calculator: 0 Patient Fall Risk Level: Low Fall Risk  Fall Risk Patient at Risk for Falls Due to: No Fall Risks Fall risk Follow up: Falls evaluation completed; Education provided; Falls prevention discussed  Home and Transportation Safety: All rugs have non-skid backing?: yes All stairs or steps have railings?: yes Grab bars in the bathtub or shower?: yes Have non-skid surface in bathtub or shower?:  yes Good home lighting?: yes Regular seat belt use?: yes Hospital stays in the last year:: no  Cognitive Assessment Difficulty concentrating, remembering, or making decisions? : no Will 6CIT or Mini Cog be Completed: no 6CIT or Mini Cog Declined: patient alert, oriented, able to answer questions appropriately and recall recent events  Advance Directives (For Healthcare) Does Patient Have a Medical Advance Directive?: No Would patient like information on creating a medical advance directive?: Yes (MAU/Ambulatory/Procedural Areas - Information given)  Reviewed/Updated  Reviewed/Updated: Reviewed All (Medical, Surgical, Family, Medications, Allergies, Care Teams, Patient Goals)    Allergies (verified) Patient has no known allergies.   Current Medications (verified) Outpatient Encounter Medications as of 04/09/2024  Medication Sig   acetaminophen (TYLENOL) 500 MG tablet Take 500-1,000 mg by mouth every 6 (six) hours as needed (pain.).   ALPRAZolam  (XANAX ) 0.25 MG tablet TAKE 1 TABLET BY MOUTH EVERY DAY AS NEEDED FOR ANXIETY   amLODipine  (NORVASC ) 2.5 MG tablet Take 1 tablet (2.5 mg total) by mouth daily.   atorvastatin (LIPITOR) 20 MG tablet Take 20 mg by mouth in the morning.   Calcium  Citrate-Vitamin D (CALCIUM  CITRATE+D3 PO) Take 1 tablet by mouth 2 (two) times daily.   Cholecalciferol (VITAMIN D3 PO) Take 2,000 Units by mouth in the morning.   esomeprazole (NEXIUM) 20 MG capsule Take 20 mg by mouth daily.    Glycerin-Hypromellose-PEG 400 (DRY EYE RELIEF DROPS) 0.2-0.2-1 % SOLN Place 1-2 drops into both eyes 3 (three) times daily as needed (dry/irritated eyes.).   ibuprofen (ADVIL) 200 MG tablet Take 400-600 mg by mouth every 8 (eight) hours as needed (pain.).   metoprolol  succinate (TOPROL -XL) 100 MG 24 hr tablet Take 1 tablet (100 mg total) by mouth 2 (two) times daily. Take with or  immediately following a meal.   neomycin-bacitracin-polymyxin (NEOSPORIN) ointment Apply 1  application  topically as needed for wound care.   venlafaxine  XR (EFFEXOR -XR) 75 MG 24 hr capsule TAKE 3 CAPSULES (225 MG TOTAL) BY MOUTH AT BEDTIME.   [DISCONTINUED] VIVELLE -DOT 0.1 MG/24HR patch Place 1 patch onto the skin every Wednesday and Saturday.    [DISCONTINUED] nitrofurantoin , macrocrystal-monohydrate, (MACROBID ) 100 MG capsule Take 1 capsule (100 mg total) by mouth 2 (two) times daily.   [DISCONTINUED] sulfamethoxazole -trimethoprim  (BACTRIM  DS) 800-160 MG tablet Take 1 tablet by mouth 2 (two) times daily.   No facility-administered encounter medications on file as of 04/09/2024.    History: Past Medical History:  Diagnosis Date   Arthritis    Cataract    Had cataract surgery Oct 2024 and Nov 2024   Fibromyalgia    GERD (gastroesophageal reflux disease)    Hypertension 2022   Mitral valve prolapse    Neuromuscular disorder (HCC)    headaches   Past Surgical History:  Procedure Laterality Date   ABDOMINAL HYSTERECTOMY     AUGMENTATION MAMMAPLASTY Bilateral    has had implants removed   COLONOSCOPY WITH PROPOFOL  N/A 07/12/2015   Procedure: COLONOSCOPY WITH PROPOFOL ;  Surgeon: Gladis MARLA Louder, MD;  Location: [facility];  Service: Endoscopy;  Laterality: N/A;   EYE SURGERY  2024   Cataracts   TONSILLECTOMY     TRANSESOPHAGEAL ECHOCARDIOGRAM (CATH LAB) N/A 05/09/2023   Procedure: TRANSESOPHAGEAL ECHOCARDIOGRAM;  Surgeon: Santo Stanly LABOR, MD;  Location: [facility];  Service: Cardiovascular;  Laterality: N/A;   TUBAL LIGATION     Family History  Problem Relation Age of Onset   Hypercholesterolemia Mother    Cerebral aneurysm Mother    Colon cancer Father    Cancer Father    Hypertension Sister    Memory loss Paternal Aunt    Social History   Occupational History   Not on file  Tobacco Use   Smoking status: Former    Current packs/day: 0.00    Types: Cigarettes    Quit date: 07/04/2000    Years since quitting: 23.7   Smokeless tobacco: Never   Vaping Use   Vaping status: Never Used  Substance and Sexual Activity   Alcohol use: No   Drug use: No   Sexual activity: Yes   Tobacco Counseling Counseling given: Not Answered  SDOH Screenings   Food Insecurity: No Food Insecurity (04/09/2024)  Housing: Low Risk (04/09/2024)  Transportation Needs: No Transportation Needs (04/09/2024)  Utilities: Not At Risk (04/09/2024)  Depression (PHQ2-9): Low Risk (04/09/2024)  Financial Resource Strain: Low Risk (04/08/2024)  Physical Activity: Insufficiently Active (04/09/2024)  Social Connections: Moderately Isolated (04/09/2024)  Stress: Stress Concern Present (04/09/2024)  Tobacco Use: Medium Risk (04/09/2024)  Health Literacy: Adequate Health Literacy (04/09/2024)   See flowsheets for full screening details  Depression Screen PHQ 2 & 9 Depression Scale- Over the past 2 weeks, how often have you been bothered by any of the following problems? Little interest or pleasure in doing things: 0 Feeling down, depressed, or hopeless (PHQ Adolescent also includes...irritable): 0 PHQ-2 Total Score: 0 Trouble falling or staying asleep, or sleeping too much: 0 Feeling tired or having little energy: 0 Poor appetite or overeating (PHQ Adolescent also includes...weight loss): 0 Feeling bad about yourself - or that you are a failure or have let yourself or your family down: 0 Trouble concentrating on things, such as reading the newspaper or watching television (PHQ Adolescent also includes...like school work): 0  Moving or speaking so slowly that other people could have noticed. Or the opposite - being so fidgety or restless that you have been moving around a lot more than usual: 0 Thoughts that you would be better off dead, or of hurting yourself in some way: 0 PHQ-9 Total Score: 0 If you checked off any problems, how difficult have these problems made it for you to do your work, take care of things at home, or get along with other people?: Not difficult at  all  Depression Treatment Depression Interventions/Treatment : EYV7-0 Score <4 Follow-up Not Indicated     Goals Addressed             This Visit's Progress    Maintain health and independence   On track            Objective:    Today's Vitals   04/09/24 1141  BP: 118/64  Weight: 197 lb 9.6 oz (89.6 kg)  Height: 5' 5 (1.651 m)   Body mass index is 32.88 kg/m.  Hearing/Vision screen No results found. Immunizations and Health Maintenance Health Maintenance  Topic Date Due   Mammogram  05/10/2024   COVID-19 Vaccine (1) 04/25/2024 (Originally 11/24/1950)   Influenza Vaccine  06/02/2024 (Originally 10/04/2023)   Hepatitis C Screening  04/09/2025 (Originally 05/25/1968)   Medicare Annual Wellness (AWV)  04/09/2025   Colonoscopy  08/27/2025   DTaP/Tdap/Td (2 - Td or Tdap) 09/13/2026   Pneumococcal Vaccine: 50+ Years  Completed   Bone Density Scan  Completed   Zoster Vaccines- Shingrix  Completed   Meningococcal B Vaccine  Aged Out        Assessment/Plan:  This is a routine wellness examination for Jody Garrett.  Patient Care Team: Kayla Jeoffrey RAMAN, FNP as PCP - General (Family Medicine) Santo Stanly LABOR, MD as PCP - Cardiology (Cardiology) Leslee Reusing, MD as Consulting Physician (Ophthalmology)  I have personally reviewed and noted the following in the patients chart:   Medical and social history Use of alcohol, tobacco or illicit drugs  Current medications and supplements including opioid prescriptions. Functional ability and status Nutritional status Physical activity Advanced directives List of other physicians Hospitalizations, surgeries, and ER visits in previous 12 months Vitals Screenings to include cognitive, depression, and falls Referrals and appointments  Orders Placed This Encounter  Procedures   MM 3D SCREENING MAMMOGRAM BILATERAL BREAST    Standing Status:   Future    Expiration Date:   04/09/2025    Reason for Exam (SYMPTOM  OR  DIAGNOSIS REQUIRED):   breast cancer screening    Preferred imaging location?:   [facility]   In addition, I have reviewed and discussed with patient certain preventive protocols, quality metrics, and best practice recommendations. A written personalized care plan for preventive services as well as general preventive health recommendations were provided to patient.   Jody Charmaine Browner, LPN   09/06/7971   Return in 1 year (on 04/09/2025).  After Visit Summary: (In Person-Printed) AVS printed and given to the patient  Nurse Notes: No voiced or noted concerns at this time  "

## 2024-04-09 NOTE — Progress Notes (Signed)
 "  Acute Office Visit  Patient ID: Fumiye Lubben, female    DOB: Jul 24, 1950, 74 y.o.   MRN: [id number]  PCP: Kayla Jeoffrey RAMAN, FNP  Chief Complaint  Patient presents with   Medical Management of Chronic Issues     Subjective:     HPI  Ms Storbeck is here for chronic condition management.  PMH includes mitral valve prolapse, arthritis, fibromyalgia, GERD, HTN, HLD, anxiety.  She does see Cardiology, ophthalmology, dermatology, gastroenterology, dentist   MVR: moderate, repair not needed, followed by cardiology, ECHO due this July  Arthritis: controlled with PRN ibuprofen and tylenol  Fibromyalgia: controlled with PRN ibuprofen and tylenol  GERD: on nexium 20mg  daily Denies hematemesis, melena, hematochezia, occult blood in stool, iron deficiency anemia, anorexia, unexplained weight loss, dysphagia, odynophagia, persistent vomiting  HTN: on Amlodipine  2.5mg  daily, metoprolol  100mg  BID Denies chest pain, palpitations, recurrent headaches, vision changes, lightheadedness, dizziness, dyspnea on exertion, or swelling of extremities.  HLD: on Atorvastatin 20mg  every other daily  Anxiety: on Effexor  225mg  daily, PRN xanax  0.25mg   Discussed the use of AI scribe software for clinical note transcription with the patient, who gave verbal consent to proceed.  History of Present Illness Ondria Roopa Graver is a 74 year old female who presents for a follow-up regarding her hormone therapy and other chronic conditions.  She uses estrogen patches weekly to manage hot flashes, experiencing increased symptoms like 'burning up' if not used. Symptoms worsen in the summer, prompting her to space out usage. Her sister has similar symptoms.  She has a history of cataract surgery and has undergone glaucoma testing without a confirmed diagnosis. Her mother did not have glaucoma, and her sister is undergoing similar testing.  She experiences arthritis and fibromyalgia, with back and neck  pain managed with ibuprofen and Tylenol as needed. Despite symptoms, she remains active.  She has a history of mitral valve prolapse with regurgitation, monitored by cardiology. She experiences occasional dizziness and shortness of breath, particularly when climbing stairs, but these are not frequent or severe. She takes amlodipine  and metoprolol  for blood pressure management.  She takes atorvastatin for cholesterol management but not daily due to muscle aches, attempting to take it every other day. She has a long history of high cholesterol.  She takes Effexor  for anxiety and Xanax  once daily, which she finds effective. She reports having used Xanax  since her children were young and describes feeling terrible if she misses a dose.  She takes Nexium 20 mg for gastrointestinal symptoms, which she reports as effective. No difficulty swallowing or gastrointestinal bleeding.  She takes calcium  and vitamin D supplements and had a DEXA scan in 2024, with another due this year. She dislikes mammograms due to discomfort but undergoes them annually.   Review of Systems  Constitutional: Negative.   HENT: Negative.    Eyes: Negative.   Respiratory: Negative.    Gastrointestinal: Negative.   Genitourinary: Negative.   Musculoskeletal: Negative.   Skin: Negative.   Neurological: Negative.   Endo/Heme/Allergies: Negative.   Psychiatric/Behavioral: Negative.    All other systems reviewed and are negative.   Past Medical History:  Diagnosis Date   Arthritis    Cataract    Had cataract surgery Oct 2024 and Nov 2024   Fibromyalgia    GERD (gastroesophageal reflux disease)    Hypertension 2022   Mitral valve prolapse    Neuromuscular disorder (HCC)    headaches    Past Surgical History:  Procedure Laterality Date  ABDOMINAL HYSTERECTOMY     AUGMENTATION MAMMAPLASTY Bilateral    has had implants removed   COLONOSCOPY WITH PROPOFOL  N/A 07/12/2015   Procedure: COLONOSCOPY WITH PROPOFOL ;   Surgeon: Gladis MARLA Louder, MD;  Location: [facility];  Service: Endoscopy;  Laterality: N/A;   EYE SURGERY  2024   Cataracts   TONSILLECTOMY     TRANSESOPHAGEAL ECHOCARDIOGRAM (CATH LAB) N/A 05/09/2023   Procedure: TRANSESOPHAGEAL ECHOCARDIOGRAM;  Surgeon: Santo Stanly LABOR, MD;  Location: [facility];  Service: Cardiovascular;  Laterality: N/A;   TUBAL LIGATION      Outpatient Medications Prior to Visit  Medication Sig Dispense Refill   acetaminophen (TYLENOL) 500 MG tablet Take 500-1,000 mg by mouth every 6 (six) hours as needed (pain.).     ALPRAZolam  (XANAX ) 0.25 MG tablet TAKE 1 TABLET BY MOUTH EVERY DAY AS NEEDED FOR ANXIETY 30 tablet 0   amLODipine  (NORVASC ) 2.5 MG tablet Take 1 tablet (2.5 mg total) by mouth daily. 90 tablet 3   Calcium  Citrate-Vitamin D (CALCIUM  CITRATE+D3 PO) Take 1 tablet by mouth 2 (two) times daily.     Cholecalciferol (VITAMIN D3 PO) Take 2,000 Units by mouth in the morning.     esomeprazole (NEXIUM) 20 MG capsule Take 20 mg by mouth daily.      Glycerin-Hypromellose-PEG 400 (DRY EYE RELIEF DROPS) 0.2-0.2-1 % SOLN Place 1-2 drops into both eyes 3 (three) times daily as needed (dry/irritated eyes.).     ibuprofen (ADVIL) 200 MG tablet Take 400-600 mg by mouth every 8 (eight) hours as needed (pain.).     metoprolol  succinate (TOPROL -XL) 100 MG 24 hr tablet Take 1 tablet (100 mg total) by mouth 2 (two) times daily. Take with or immediately following a meal. 180 tablet 3   neomycin-bacitracin-polymyxin (NEOSPORIN) ointment Apply 1 application  topically as needed for wound care.     venlafaxine  XR (EFFEXOR -XR) 75 MG 24 hr capsule TAKE 3 CAPSULES (225 MG TOTAL) BY MOUTH AT BEDTIME. 270 capsule 1   atorvastatin (LIPITOR) 20 MG tablet Take 20 mg by mouth in the morning.     nitrofurantoin , macrocrystal-monohydrate, (MACROBID ) 100 MG capsule Take 1 capsule (100 mg total) by mouth 2 (two) times daily. 10 capsule 0   sulfamethoxazole -trimethoprim  (BACTRIM   DS) 800-160 MG tablet Take 1 tablet by mouth 2 (two) times daily. 14 tablet 0   VIVELLE -DOT 0.1 MG/24HR patch Place 1 patch onto the skin every Wednesday and Saturday.   11   No facility-administered medications prior to visit.    Allergies[1]     Objective:    BP 118/64   Ht 5' 5 (1.651 m)   Wt 197 lb (89.4 kg)   BMI 32.78 kg/m  BP Readings from Last 3 Encounters:  04/09/24 118/64  04/09/24 118/64  02/04/24 (!) 140/88   Wt Readings from Last 3 Encounters:  04/09/24 197 lb 9.6 oz (89.6 kg)  04/09/24 197 lb (89.4 kg)  02/04/24 195 lb 12.8 oz (88.8 kg)      Physical Exam Vitals and nursing note reviewed.  Constitutional:      Appearance: Normal appearance. She is normal weight.  HENT:     Head: Normocephalic and atraumatic.     Right Ear: Tympanic membrane, ear canal and external ear normal.     Left Ear: Tympanic membrane, ear canal and external ear normal.     Nose: Nose normal.     Mouth/Throat:     Mouth: Mucous membranes are moist.     Pharynx:  Oropharynx is clear.  Eyes:     Extraocular Movements: Extraocular movements intact.     Conjunctiva/sclera: Conjunctivae normal.     Pupils: Pupils are equal, round, and reactive to light.  Cardiovascular:     Rate and Rhythm: Normal rate. Rhythm irregular.     Pulses: Normal pulses.     Heart sounds: Normal heart sounds.  Pulmonary:     Effort: Pulmonary effort is normal.     Breath sounds: Normal breath sounds.  Abdominal:     General: Bowel sounds are normal.     Palpations: Abdomen is soft.  Musculoskeletal:        General: Normal range of motion.     Cervical back: Normal range of motion and neck supple.  Skin:    General: Skin is warm and dry.     Capillary Refill: Capillary refill takes less than 2 seconds.  Neurological:     General: No focal deficit present.     Mental Status: She is alert and oriented to person, place, and time. Mental status is at baseline.  Psychiatric:        Mood and Affect:  Mood normal.        Behavior: Behavior normal.        Thought Content: Thought content normal.        Judgment: Judgment normal.       No results found for any visits on 04/09/24.     Assessment & Plan:   Problem List Items Addressed This Visit       Cardiovascular and Mediastinum   Nonrheumatic mitral valve regurgitation   Relevant Medications   rosuvastatin  (CRESTOR ) 20 MG tablet   Essential hypertension   Relevant Medications   rosuvastatin  (CRESTOR ) 20 MG tablet   Hot flashes   Relevant Medications   rosuvastatin  (CRESTOR ) 20 MG tablet     Musculoskeletal and Integument   Arthritis   Osteopenia after menopause     Other   Hyperlipidemia   Relevant Medications   rosuvastatin  (CRESTOR ) 20 MG tablet   Class 1 obesity due to excess calories with serious comorbidity and body mass index (BMI) of 30.0 to 30.9 in adult   Generalized anxiety disorder   Fibromyalgia   Physical exam, annual - Primary   Other Visit Diagnoses       Encounter for screening mammogram for malignant neoplasm of breast       Relevant Orders   MM DIGITAL SCREENING BILATERAL       Assessment and Plan Assessment & Plan Menopausal symptoms Managed with estrogen patches, plan to wean off due to cost and age. - Continue to wean off estrogen patches by extending the interval between applications.  Osteopenia after menopause Managed with calcium  and vitamin D. DEXA scan due this year. - Ordered DEXA scan.  Nonrheumatic mitral valve regurgitation with prolapse Moderate prolapse with regurgitation, no significant symptoms. Cardiologist follow-up scheduled. - Continue current management and follow up with cardiologist in September.  Essential hypertension Well-controlled with amlodipine  and metoprolol . - Continue current antihypertensive regimen.  Hyperlipidemia Cholesterol high, atorvastatin not tolerated daily due to myalgias. Plan to switch to rosuvastatin . - Switched to rosuvastatin   20mg  daily, instructed to take daily if tolerated, otherwise every other day. - If rosuvastatin  is not tolerated, will consider adding Zetia. - Will recheck cholesterol levels in six weeks.  Generalized anxiety disorder Managed with Effexor  and Xanax . Concerns about Xanax  dependency. - Continue Effexor . - Use Xanax  sparingly to minimize dependency risk.  Osteoarthritis  and fibromyalgia Chronic pain managed with ibuprofen and Tylenol. Symptoms stable. - Continue current pain management regimen with ibuprofen and Tylenol as needed.  General Health Maintenance Mammogram and DEXA scan due this year. Discussed importance of screenings. - Ordered mammogram. - Ordered DEXA scan.    Meds ordered this encounter  Medications   VIVELLE -DOT 0.1 MG/24HR patch    Sig: Place 1 patch (0.1 mg total) onto the skin once a week.    Dispense:  4 patch    Refill:  2   rosuvastatin  (CRESTOR ) 20 MG tablet    Sig: Take 1 tablet (20 mg total) by mouth daily.    Dispense:  90 tablet    Refill:  3    Supervising Provider:   DUANNE LOWERS T [[id number]]    Return in about 6 months (around 10/07/2024) for chronic follow-up with labs 1 week prior.  Jeoffrey GORMAN Barrio, FNP [facility]      [1] No Known Allergies  "

## 2024-04-09 NOTE — Patient Instructions (Addendum)
 Jody Garrett,  Thank you for taking the time for your Medicare Wellness Visit. I appreciate your continued commitment to your health goals. Please review the care plan we discussed, and feel free to reach out if I can assist you further.  Please note that Annual Wellness Visits do not include a physical exam. Some assessments may be limited, especially if the visit was conducted virtually. If needed, we may recommend an in-person follow-up with your provider.  Ongoing Care Seeing your primary care provider every 3 to 6 months helps us  monitor your health and provide consistent, personalized care.   Referrals If a referral was made during today's visit and you haven't received any updates within two weeks, please contact the referred provider directly to check on the status.  You have an order for:  []   2D Mammogram  [x]   3D Mammogram  []   Bone Density     Please call for appointment:   [facility] [address] [place], [place] [address] [phone]  Make sure to wear two-piece clothing.  No lotions, powders, or deodorants the day of the appointment. Make sure to bring picture ID and insurance card.  Bring list of medications you are currently taking including any supplements.   Recommended Screenings:  Health Maintenance  Topic Date Due   Breast Cancer Screening  05/10/2024   COVID-19 Vaccine (1) 04/25/2024*   Flu Shot  06/02/2024*   Hepatitis C Screening  04/09/2025*   Medicare Annual Wellness Visit  04/09/2025   Colon Cancer Screening  08/27/2025   DTaP/Tdap/Td vaccine (2 - Td or Tdap) 09/13/2026   Pneumococcal Vaccine for age over 32  Completed   Osteoporosis screening with Bone Density Scan  Completed   Zoster (Shingles) Vaccine  Completed   Meningitis B Vaccine  Aged Out  *Topic was postponed. The date shown is not the original due date.       04/09/2024   11:21 AM  Advanced Directives  Does Patient Have a Medical Advance Directive? No   Would patient like information on creating a medical advance directive? Yes (MAU/Ambulatory/Procedural Areas - Information given)   Information on Advanced Care Planning can be found at [place]  Secretary of [facility] Advance Health Care Directives Advance Health Care Directives ([email or web address]   Vision: Annual vision screenings are recommended for early detection of glaucoma, cataracts, and diabetic retinopathy. These exams can also reveal signs of chronic conditions such as diabetes and high blood pressure.  Dental: Annual dental screenings help detect early signs of oral cancer, gum disease, and other conditions linked to overall health, including heart disease and diabetes.  Please see the attached documents for additional preventive care recommendations.

## 2024-05-25 ENCOUNTER — Other Ambulatory Visit

## 2024-09-28 ENCOUNTER — Other Ambulatory Visit

## 2024-10-07 ENCOUNTER — Ambulatory Visit: Admitting: Family Medicine
